# Patient Record
Sex: Female | Born: 1985 | Race: Black or African American | Hispanic: No | Marital: Single | Smoking: Never smoker
Health system: Southern US, Community
[De-identification: ages and names within clinical notes are randomized; demographics above are authoritative.]

## PROBLEM LIST (undated history)

## (undated) DIAGNOSIS — I10 Essential (primary) hypertension: Secondary | ICD-10-CM

## (undated) DIAGNOSIS — R011 Cardiac murmur, unspecified: Secondary | ICD-10-CM

## (undated) HISTORY — PX: DENTAL SURGERY: SHX609

---
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
## 2007-07-13 ENCOUNTER — Emergency Department (HOSPITAL_COMMUNITY): Admission: EM | Admit: 2007-07-13 | Discharge: 2007-07-13 | Payer: Self-pay | Admitting: Emergency Medicine

## 2007-10-17 ENCOUNTER — Emergency Department (HOSPITAL_COMMUNITY): Admission: EM | Admit: 2007-10-17 | Discharge: 2007-10-17 | Payer: Self-pay | Admitting: Emergency Medicine

## 2008-04-07 ENCOUNTER — Ambulatory Visit (HOSPITAL_COMMUNITY): Admission: RE | Admit: 2008-04-07 | Discharge: 2008-04-07 | Payer: Self-pay | Admitting: Obstetrics and Gynecology

## 2008-04-07 ENCOUNTER — Encounter (INDEPENDENT_AMBULATORY_CARE_PROVIDER_SITE_OTHER): Payer: Self-pay | Admitting: Obstetrics and Gynecology

## 2008-08-10 ENCOUNTER — Emergency Department (HOSPITAL_COMMUNITY): Admission: EM | Admit: 2008-08-10 | Discharge: 2008-08-10 | Payer: Self-pay | Admitting: Emergency Medicine

## 2009-02-07 ENCOUNTER — Encounter: Admission: RE | Admit: 2009-02-07 | Discharge: 2009-02-07 | Payer: Self-pay | Admitting: Family Medicine

## 2009-11-29 ENCOUNTER — Emergency Department (HOSPITAL_COMMUNITY): Admission: EM | Admit: 2009-11-29 | Discharge: 2009-11-29 | Payer: Self-pay | Admitting: Family Medicine

## 2010-08-27 ENCOUNTER — Emergency Department (HOSPITAL_COMMUNITY): Admission: EM | Admit: 2010-08-27 | Discharge: 2010-08-27 | Payer: Self-pay | Admitting: *Deleted

## 2010-11-04 ENCOUNTER — Emergency Department (HOSPITAL_COMMUNITY)
Admission: EM | Admit: 2010-11-04 | Discharge: 2010-11-04 | Payer: Self-pay | Source: Home / Self Care | Attending: Emergency Medicine | Admitting: Emergency Medicine

## 2011-02-13 LAB — URINALYSIS, ROUTINE W REFLEX MICROSCOPIC
Glucose, UA: NEGATIVE mg/dL
Ketones, ur: NEGATIVE mg/dL
Protein, ur: 100 mg/dL — AB
Urobilinogen, UA: 0.2 mg/dL (ref 0.0–1.0)

## 2011-02-13 LAB — POCT CARDIAC MARKERS
CKMB, poc: <1 ng/mL — ABNORMAL LOW (ref 1.0–8.0)
Myoglobin, poc: 67 ng/mL (ref 12–200)
Troponin i, poc: <0.05 ng/mL (ref 0.00–0.09)

## 2011-02-13 LAB — DIFFERENTIAL
Lymphocytes Relative: 21 % (ref 12–46)
Lymphs Abs: 1.5 10*3/uL (ref 0.7–4.0)
Monocytes Relative: 7 % (ref 3–12)
Neutro Abs: 5.3 10*3/uL (ref 1.7–7.7)
Neutrophils Relative %: 71 % (ref 43–77)

## 2011-02-13 LAB — POCT I-STAT, CHEM 8
Creatinine, Ser: 0.8 mg/dL (ref 0.4–1.2)
HCT: 41 % (ref 36.0–46.0)
Hemoglobin: 13.9 g/dL (ref 12.0–15.0)
Potassium: 3.8 mEq/L (ref 3.5–5.1)
Sodium: 140 mEq/L (ref 135–145)
TCO2: 25 mmol/L (ref 0–100)

## 2011-02-13 LAB — CBC
HCT: 39.2 % (ref 36.0–46.0)
Hemoglobin: 13.2 g/dL (ref 12.0–15.0)
MCH: 28 pg (ref 26.0–34.0)
MCHC: 33.6 g/dL (ref 30.0–36.0)
MCV: 83.4 fL (ref 78.0–100.0)
Platelets: 197 10*3/uL (ref 150–400)
RBC: 4.71 MIL/uL (ref 3.87–5.11)
RDW: 14.1 % (ref 11.5–15.5)
WBC: 7.4 10*3/uL (ref 4.0–10.5)

## 2011-02-13 LAB — URINE MICROSCOPIC-ADD ON

## 2011-03-03 LAB — POCT URINALYSIS DIP (DEVICE)
Glucose, UA: NEGATIVE mg/dL
Hgb urine dipstick: NEGATIVE
Ketones, ur: NEGATIVE mg/dL
Protein, ur: NEGATIVE mg/dL
Specific Gravity, Urine: 1.02 (ref 1.005–1.030)

## 2011-03-03 LAB — URINE CULTURE
Colony Count: NO GROWTH
Culture: NO GROWTH

## 2011-03-03 LAB — POCT PREGNANCY, URINE: Preg Test, Ur: NEGATIVE

## 2011-04-15 NOTE — Op Note (Signed)
NAMEWARRENE, Meredith Yoder          ACCOUNT NO.:  [id number]   MEDICAL RECORD NO.:  [id number]          PATIENT TYPE:  AMB   LOCATION:  SDC                           FACILITY:  [facility]   PHYSICIAN:  Maxie Better, M.D.DATE OF BIRTH:  [birth year]   DATE OF PROCEDURE:  04/07/2008  DATE OF DISCHARGE:                               OPERATIVE REPORT   PREOPERATIVE DIAGNOSIS:  Elective termination.   PROCEDURE:  Suction dilation and evacuation.   POSTOPERATIVE DIAGNOSIS:  Elective termination.   ANESTHESIA:  MAC paracervical block.   SURGEON:  Maxie Better, MD   PROCEDURE:  Under adequate monitored anesthesia, the patient was placed  in the dorsal lithotomy position.  She was sterilely prepped and draped  in usual fashion.  Bladder was catheterized for a moderate amount of  urine.  Examination under anesthesia revealed anteverted 8-week size  uterus.  No adnexal masses could be appreciated.  Bivalve speculum was  placed in the vagina.  20 mL of 1% Nesacaine was injected paracervical  at 3 and 9 o'clock.  The anterior lip of the cervix was grasped with a  single-tooth tenaculum.  The cervix was then fully dilated up to #27  [facility] dilator and #7-mm curved suction cannula was introduced in the  uterine cavity.  The cavity was suctioned, curetted, and suctioned.  When all tissue was felt to be removed, all instruments were then  removed from the vagina.   SPECIMEN:  Labeled products of conception were sent to pathology.   ESTIMATED BLOOD LOSS:  Minimal.   COMPLICATIONS:  None.   MATERNAL BLOOD TYPE:  O+.   The patient tolerated the procedure well and was transferred to recovery  room in stable condition.      Maxie Better, M.D.  Electronically Signed     [place]/MEDQ  D:  04/07/2008  T:  04/08/2008  Job:  [id number]

## 2012-06-25 ENCOUNTER — Encounter (HOSPITAL_COMMUNITY): Payer: Self-pay | Admitting: *Deleted

## 2012-06-25 ENCOUNTER — Emergency Department (HOSPITAL_COMMUNITY)
Admission: EM | Admit: 2012-06-25 | Discharge: 2012-06-25 | Disposition: A | Discharge status: Home / Self Care | Payer: 59 | Attending: Emergency Medicine | Admitting: Emergency Medicine

## 2012-06-25 DIAGNOSIS — R102 Pelvic and perineal pain: Secondary | ICD-10-CM

## 2012-06-25 DIAGNOSIS — N949 Unspecified condition associated with female genital organs and menstrual cycle: Secondary | ICD-10-CM | POA: Insufficient documentation

## 2012-06-25 DIAGNOSIS — N920 Excessive and frequent menstruation with regular cycle: Secondary | ICD-10-CM | POA: Insufficient documentation

## 2012-06-25 MED ORDER — HYDROCODONE-IBUPROFEN 7.5-200 MG PO TABS: ORAL_TABLET | ORAL | Status: DC

## 2012-06-25 MED ORDER — IBUPROFEN 800 MG PO TABS
800.0000 mg | ORAL_TABLET | Freq: Once | ORAL | Status: AC
  Administered 2012-06-25: 800 mg via ORAL
  Filled 2012-06-25: qty 1

## 2012-06-25 MED ORDER — HYDROCODONE-ACETAMINOPHEN 5-325 MG PO TABS
1.0000 | ORAL_TABLET | Freq: Once | ORAL | Status: AC
  Administered 2012-06-25: 1 via ORAL
  Filled 2012-06-25: qty 1

## 2012-06-25 NOTE — ED Notes (Signed)
Pt her menstrual cycle came on and she is bleeding more than normal and having increased cramping. Pt denies any n/v

## 2012-06-25 NOTE — ED Provider Notes (Signed)
History     CSN: [id number]  Arrival date & time 06/25/12  1751   First MD Initiated Contact with Patient 06/25/12 1907      Chief Complaint  Patient presents with  . Abdominal Pain    (Consider location/radiation/quality/duration/timing/severity/associated sxs/prior treatment) HPI Comments: Started menses 2 days ago.  "bleeding a little more than usual and cramping more than usual".  Has regular menses.   Does not  Feel she is pregnant.  No fever or chills.  Has PCP and GYN MDs.  Took 2 gm of tylenol this AM with no relief.  Patient is a 26 y.o. female presenting with abdominal pain. The history is provided by the patient. No language interpreter was used.  Abdominal Pain The primary symptoms of the illness include abdominal pain and vaginal bleeding. The primary symptoms of the illness do not include fever, nausea, vomiting, diarrhea, dysuria or vaginal discharge.  The patient states that she believes she is currently not pregnant. The patient has not had a change in bowel habit. Symptoms associated with the illness do not include urgency, hematuria or frequency.    History reviewed. No pertinent past medical history.  History reviewed. No pertinent past surgical history.  No family history on file.  History  Substance Use Topics  . Smoking status: Never Smoker   . Smokeless tobacco: Not on file  . Alcohol Use: Yes    OB History    Grav Para Term Preterm Abortions TAB SAB Ect Mult Living                  Review of Systems  Constitutional: Negative for fever.  Gastrointestinal: Positive for abdominal pain. Negative for nausea, vomiting and diarrhea.  Genitourinary: Positive for vaginal bleeding, menstrual problem and pelvic pain. Negative for dysuria, urgency, frequency, hematuria, flank pain and vaginal discharge.  All other systems reviewed and are negative.    Allergies  Review of patient's allergies indicates no known allergies.  Home Medications   Current  Outpatient Rx  Name Route Sig Dispense Refill  . HYDROCODONE-IBUPROFEN 7.5-200 MG PO TABS  One tab po q 6 hrs prn pain 20 tablet 0    BP 143/73  Pulse 69  Temp 99.3 F (37.4 C) (Oral)  Resp 16  SpO2 100%  LMP 06/25/2012  Physical Exam  Nursing note and vitals reviewed. Constitutional: She is oriented to person, place, and time. She appears well-developed and well-nourished. No distress.  HENT:  Head: Normocephalic and atraumatic.  Eyes: EOM are normal.  Neck: Normal range of motion.  Cardiovascular: Normal rate, regular rhythm and normal heart sounds.   Pulmonary/Chest: Effort normal and breath sounds normal.  Abdominal: Soft. She exhibits no distension. There is tenderness in the suprapubic area. There is no rigidity, no rebound, no guarding and no CVA tenderness.    Genitourinary: No vaginal discharge found.  Musculoskeletal: Normal range of motion.  Neurological: She is alert and oriented to person, place, and time.  Skin: Skin is warm and dry.  Psychiatric: She has a normal mood and affect. Judgment normal.    ED Course  Procedures (including critical care time)  Labs Reviewed - No data to display No results found.   1. Menorrhagia with regular cycle   2. Pelvic pain in female       MDM  rx-hydrocodone, 20 Ibuprofen 800 mg TID F/u with your GYN MD        Evalina Field, PA 06/25/12 1925

## 2012-06-26 NOTE — ED Provider Notes (Signed)
Medical screening examination/treatment/procedure(s) were conducted as a shared visit with non-physician practitioner(s) and myself.  I personally evaluated the patient during the encounter  Derwood Kaplan, MD 06/26/12 1449

## 2012-07-26 ENCOUNTER — Emergency Department (HOSPITAL_COMMUNITY)
Admission: EM | Admit: 2012-07-26 | Discharge: 2012-07-26 | Disposition: A | Discharge status: Home / Self Care | Payer: 59 | Attending: Emergency Medicine | Admitting: Emergency Medicine

## 2012-07-26 ENCOUNTER — Emergency Department (HOSPITAL_COMMUNITY): Payer: 59

## 2012-07-26 ENCOUNTER — Encounter (HOSPITAL_COMMUNITY): Payer: Self-pay | Admitting: *Deleted

## 2012-07-26 DIAGNOSIS — Z043 Encounter for examination and observation following other accident: Secondary | ICD-10-CM | POA: Insufficient documentation

## 2012-07-26 MED ORDER — METHOCARBAMOL 750 MG PO TABS: 750.0000 mg | ORAL_TABLET | Freq: Four times a day (QID) | ORAL | Status: AC

## 2012-07-26 MED ORDER — IBUPROFEN 600 MG PO TABS: 600.0000 mg | ORAL_TABLET | Freq: Four times a day (QID) | ORAL | Status: AC | PRN

## 2012-07-26 MED ORDER — ACETAMINOPHEN 325 MG PO TABS
650.0000 mg | ORAL_TABLET | Freq: Once | ORAL | Status: AC
  Administered 2012-07-26: 650 mg via ORAL
  Filled 2012-07-26: qty 2
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
## 2012-07-26 NOTE — ED Notes (Signed)
Pt taken off back board

## 2015-03-26 ENCOUNTER — Emergency Department (HOSPITAL_BASED_OUTPATIENT_CLINIC_OR_DEPARTMENT_OTHER)
Admission: EM | Admit: 2015-03-26 | Discharge: 2015-03-26 | Disposition: A | Discharge status: Home / Self Care | Payer: Self-pay | Attending: Emergency Medicine | Admitting: Emergency Medicine

## 2015-03-26 ENCOUNTER — Encounter (HOSPITAL_BASED_OUTPATIENT_CLINIC_OR_DEPARTMENT_OTHER): Payer: Self-pay | Admitting: *Deleted

## 2015-03-26 DIAGNOSIS — E669 Obesity, unspecified: Secondary | ICD-10-CM | POA: Insufficient documentation

## 2015-03-26 DIAGNOSIS — Z3202 Encounter for pregnancy test, result negative: Secondary | ICD-10-CM | POA: Insufficient documentation

## 2015-03-26 DIAGNOSIS — R609 Edema, unspecified: Secondary | ICD-10-CM | POA: Insufficient documentation

## 2015-03-26 LAB — URINE MICROSCOPIC-ADD ON

## 2015-03-26 LAB — BASIC METABOLIC PANEL
Anion gap: 7 (ref 5–15)
BUN: 12 mg/dL (ref 6–23)
CHLORIDE: 107 mmol/L (ref 96–112)
CO2: 25 mmol/L (ref 19–32)
Calcium: 8.8 mg/dL (ref 8.4–10.5)
Creatinine, Ser: 0.86 mg/dL (ref 0.50–1.10)
GFR calc Af Amer: >90 mL/min (ref >90)
GFR calc non Af Amer: >90 mL/min (ref >90)
GLUCOSE: 84 mg/dL (ref 70–99)
POTASSIUM: 3.4 mmol/L — AB (ref 3.5–5.1)
SODIUM: 139 mmol/L (ref 135–145)

## 2015-03-26 LAB — URINALYSIS, ROUTINE W REFLEX MICROSCOPIC
BILIRUBIN URINE: NEGATIVE
Glucose, UA: NEGATIVE mg/dL
Hgb urine dipstick: NEGATIVE
KETONES UR: NEGATIVE mg/dL
NITRITE: NEGATIVE
PROTEIN: NEGATIVE mg/dL
SPECIFIC GRAVITY, URINE: 1.025 (ref 1.005–1.030)
UROBILINOGEN UA: 0.2 mg/dL (ref 0.0–1.0)
pH: 7 (ref 5.0–8.0)

## 2015-03-26 LAB — PREGNANCY, URINE: PREG TEST UR: NEGATIVE

## 2015-03-26 NOTE — ED Provider Notes (Signed)
CSN: [id number]     Arrival date & time 03/26/15  1528 History   First MD Initiated Contact with Patient 03/26/15 1723     Chief Complaint  Patient presents with  . Leg Swelling     (Consider location/radiation/quality/duration/timing/severity/associated sxs/prior Treatment) HPI  Pt is a 29yo obese female, presenting to ED with reports of bilateral arm and leg swelling that has worsening over the last 3 days with associated mild lower leg cramping bilaterally.  Leg cramps are 8/10 at worst.  Denies taking any pain medication PTA. Pt states she sits at a desk all day and thinks they may be what caused her feet to swell but no increase in walking or standing. Denies taking any daily medications. Denies hx of CAD, CHF, DM, DVT or PE. Denies chest pain or SOB. Denies increased weight change.  States she had a physical 1 year ago and was "healthy" at that time. Denies hx of HTN or high cholesterol. Denies any other symptoms.   History reviewed. No pertinent past medical history. History reviewed. No pertinent past surgical history. No family history on file. History  Substance Use Topics  . Smoking status: Never Smoker   . Smokeless tobacco: Not on file  . Alcohol Use: Yes   OB History    No data available     Review of Systems  Constitutional: Negative for fever, chills, diaphoresis, fatigue and unexpected weight change.  Respiratory: Negative for cough, shortness of breath, wheezing and stridor.   Cardiovascular: Positive for leg swelling. Negative for chest pain and palpitations.  Gastrointestinal: Negative for nausea, vomiting, abdominal pain and diarrhea.  Musculoskeletal: Positive for myalgias. Negative for arthralgias.  All other systems reviewed and are negative.     Allergies  Review of patient's allergies indicates no known allergies.  Home Medications   Prior to Admission medications   Medication Sig Start Date End Date Taking? Authorizing Provider   HYDROcodone-ibuprofen (VICOPROFEN) 7.5-200 MG per tablet Take 1 tablet by mouth every 6 (six) hours as needed. For pain.    Historical Provider, MD   BP 137/88 mmHg  Pulse 97  Temp(Src) 98.9 F (37.2 C) (Oral)  Resp 20  Ht 5\' 6"  (1.676 m)  Wt 270 lb (122.471 kg)  BMI 43.60 kg/m2  SpO2 100%  LMP 03/01/2015 Physical Exam  Constitutional: She appears well-developed and well-nourished. No distress.  Obese female lying comfortably on exam bed, NAD  HENT:  Head: Normocephalic and atraumatic.  Eyes: Conjunctivae are normal. No scleral icterus.  Neck: Normal range of motion.  Cardiovascular: Normal rate, regular rhythm and normal heart sounds.   Pulses:      Radial pulses are 2+ on the right side, and 2+ on the left side.       Dorsalis pedis pulses are 2+ on the right side, and 2+ on the left side.  Pulmonary/Chest: Effort normal and breath sounds normal. No respiratory distress. She has no wheezes. She has no rales. She exhibits no tenderness.  Abdominal: Soft. Bowel sounds are normal. She exhibits no distension and no mass. There is no tenderness. There is no rebound and no guarding.  Musculoskeletal: Normal range of motion. She exhibits no edema or tenderness.  No edema appreciated on exam.   Neurological: She is alert.  Skin: Skin is warm and dry. She is not diaphoretic. No erythema.  Nursing note and vitals reviewed.   ED Course  Procedures (including critical care time) Labs Review Labs Reviewed  URINALYSIS, ROUTINE W REFLEX MICROSCOPIC -  Abnormal; Notable for the following:    APPearance CLOUDY (*)    Leukocytes, UA MODERATE (*)    All other components within normal limits  URINE MICROSCOPIC-ADD ON - Abnormal; Notable for the following:    Squamous Epithelial / LPF MANY (*)    Bacteria, UA MANY (*)    All other components within normal limits  BASIC METABOLIC PANEL - Abnormal; Notable for the following:    Potassium 3.4 (*)    All other components within normal limits   PREGNANCY, URINE    Imaging Review No results found.   EKG Interpretation None      MDM   Final diagnoses:  Peripheral edema    Pt presenting to ED with c/o edema in upper and lower extremities bilaterally.  Lungs: CTAB. Vitals: WNL. Renal function: WNL.  PERC negative.  UA: many bacteria, however, also many squamous cells.  Pt not c/o urinary symptoms. Will not tx for UTI.    Discussed pt with Dr. Judd Lien, no evidence of emergent process taking place at this time, including but not limited to- PE, DVT, ACS, CHF, ARF.  Home care instructions provided. Return precautions provided. Pt verbalized understanding and agreement with tx plan.    Junius Finner, PA-C 03/27/15 0129  Paula Libra, MD 03/27/15 4098  Geoffery Lyons, MD 03/30/15 2014

## 2015-03-26 NOTE — ED Notes (Signed)
Swelling to her ankles and legs x 3 days. Feels like her arms are puffy.

## 2015-03-26 NOTE — Discharge Instructions (Signed)
Peripheral Edema °You have swelling in your legs (peripheral edema). This swelling is due to excess accumulation of salt and water in your body. Edema may be a sign of heart, kidney or liver disease, or a side effect of a medication. It may also be due to problems in the leg veins. Elevating your legs and using special support stockings may be very helpful, if the cause of the swelling is due to poor venous circulation. Avoid long periods of standing, whatever the cause. °Treatment of edema depends on identifying the cause. Chips, pretzels, pickles and other salty foods should be avoided. Restricting salt in your diet is almost always needed. Water pills (diuretics) are often used to remove the excess salt and water from your body via urine. These medicines prevent the kidney from reabsorbing sodium. This increases urine flow. °Diuretic treatment may also result in lowering of potassium levels in your body. Potassium supplements may be needed if you have to use diuretics daily. Daily weights can help you keep track of your progress in clearing your edema. You should call your caregiver for follow up care as recommended. °SEEK IMMEDIATE MEDICAL CARE IF:  °· You have increased swelling, pain, redness, or heat in your legs. °· You develop shortness of breath, especially when lying down. °· You develop chest or abdominal pain, weakness, or fainting. °· You have a fever. °Document Released: 12/25/2004 Document Revised: 02/09/2012 Document Reviewed: 12/05/2009 °ExitCare® Patient Information ©2015 [employer]. This information is not intended to replace advice given to you by your health care provider. Make sure you discuss any questions you have with your health care provider. ° ° °Emergency Department Resource Guide °1) Find a Doctor and Pay Out of Pocket °Although you won't have to find out who is covered by your insurance plan, it is a good idea to ask around and get recommendations. You will then need to call the  office and see if the doctor you have chosen will accept you as a new patient and what types of options they offer for patients who are self-pay. Some doctors offer discounts or will set up payment plans for their patients who do not have insurance, but you will need to ask so you aren't surprised when you get to your appointment. ° °2) Contact Your Local Health Department °Not all health departments have doctors that can see patients for sick visits, but many do, so it is worth a call to see if yours does. If you don't know where your local health department is, you can check in your phone book. The CDC also has a tool to help you locate your state's health department, and many state websites also have listings of all of their local health departments. ° °3) Find a Walk-in Clinic °If your illness is not likely to be very severe or complicated, you may want to try a walk in clinic. These are popping up all over the country in pharmacies, drugstores, and shopping centers. They're usually staffed by nurse practitioners or physician assistants that have been trained to treat common illnesses and complaints. They're usually fairly quick and inexpensive. However, if you have serious medical issues or chronic medical problems, these are probably not your best option. ° °No Primary Care Doctor: °- Call [facility] at  832-8000 - they can help you locate a primary care doctor that  accepts your insurance, provides certain services, etc. °- Physician Referral Service- 1-800-533-3463 ° °Chronic Pain Problems: °Organization           Address  Phone   Notes  ° [facility]  (336) 297-2271 Patients need to be referred by their primary care doctor.  ° °Medication Assistance: °Organization         Address  Phone   Notes  °[place] [facility] [address] °[place], [place] [address] (336) 641-8030 --Must be a resident of [place] °-- Must have NO insurance coverage  whatsoever (no Medicaid/ Medicare, etc.) °-- The pt. MUST have a primary care doctor that directs their care regularly and follows them in the community °  °[facility]  (866) 331-1348   °[employer]  (888) 892-1162   ° °Agencies that provide inexpensive medical care: °Organization         Address  Phone   Notes  °[facility]  (336) 832-8035   °[facility]    (336) 832-7272   °[facility] [address] °[place], [place] [address] (336) 832-4777   °[facility] [address], °[place] (336) 271-4999   °[facility]    (336) 373-0678   °[facility]    (336) 272-1050   °[facility] ° [address], [place] Phone:  (336) 832-4444, Fax:  (336) 832-4440 Hours of Operation:  9 am - 6 pm, M-F.  Also accepts Medicaid/Medicare and self-pay.  °[facility] ° [address], [place] Phone: (336) 832-3150, Fax: (336) 832-3151. Hours of Operation:  8:30 am - 5:30 pm, M-F.  Also accepts Medicaid and self-pay.  °[facility] [place] [address], [place] Phone: (336) 878-6027   °[facility] [address], [place], [place] (336)723-1848, Ext. 123 Mondays & Thursdays: 7-9 AM.  First 15 patients are seen on a first come, first serve basis. °  ° °Medicaid-accepting [place] Providers: ° °Organization         Address  Phone   Notes  °[facility] [address], [place] (336) 641-2100 Also accepts self-pay patients.  °[facility] [address], [place] ° (336) 856-9996   °[facility] [address], [place] (336) 288-8857   °[facility] [address] [place] [address], [place] (336) 299-7000   °Veita Bland [address], [place]  ° (336) 373-1557 Only accepts [place] Access Medicaid patients after they have their name applied  to their card.  ° °Self-Pay (no insurance) in [place]: ° °Organization         Address  Phone   Notes  °Sickle Cell Patients, [facility] [address], [place] (336) 832-1970   °[facility] [address], [place] (336) 832-4400   °[facility] ° [address], [place] (336) 992-4800   °[facility]/Dr. Osei-Bonsu ° [address] [place] [address], [place] or [address], [place] (336) 841-8500 Phone number for both [place] and [place] locations is the same.  °[facility] [address], [place] (336) 299-0000   °[facility] [address] [place] [address], [place] or [address] (336) 852-7530 °(336) 878-2260   °[facility] [address],  (336) 350-1642, phone; (336) 294-5005, fax Sees patients 1st and 3rd Saturday of every month.  Must not qualify   for public or private insurance (i.e. Medicaid, Medicare, [place] Health Choice, Veterans' Benefits) • Household income should be no more than 200% of the poverty level •The clinic cannot treat you if you are pregnant or think you are pregnant • Sexually transmitted diseases are not treated at the clinic.  ° ° °Dental Care: °Organization         Address  Phone  Notes  °[facility] [address], [place] (336) 641-6152 Accepts children up to age 21 who are enrolled in Medicaid or [place] Health Choice; pregnant women with a Medicaid card; and children who have applied for Medicaid or [place] Health Choice, but were declined, whose parents can pay a reduced fee at time of service.  °[facility] [place]  [address], [place] (336) 641-7733 Accepts children up to age 21 who are enrolled in Medicaid or [place] Health Choice; pregnant women with a Medicaid card; and children who have applied for Medicaid or [place]  Health Choice, but were declined, whose parents can pay a reduced fee at time of service.  °[facility] ° [address], [place] (336) 641-4533 Patients are seen by appointment only. Walk-ins are not accepted. [facility] will see patients 18 years of age and older. °Monday - Tuesday (8am-5pm) °Most Wednesdays (8:30-5pm) °$30 per visit, cash only  °[facility] ° [address], [place] (336) 641-4533 Patients are seen by appointment only. Walk-ins are not accepted. [facility] will see patients 18 years of age and older. °One Wednesday Evening (Monthly: Volunteer Based).  $30 per visit, cash only  °[facility]  (919) 537-3737 for adults; Children under age 4, call Graduate Pediatric Dentistry at (919) [phone]. Children aged 4-14, please call (919) [phone] to request a pediatric application. ° Dental services are provided in all areas of dental care including fillings, crowns and bridges, complete and partial dentures, implants, gum treatment, root canals, and extractions. Preventive care is also provided. Treatment is provided to both adults and children. °Patients are selected via a lottery and there is often a waiting list. °  °[facility] [address], °[place] ° (336) 763-8833 [email or web address] °  °[facility] [address], [place], [place] (336)723-1848, Ext. 123 Second and Fourth Thursday of each month, opens at 6:30 AM; Clinic ends at 9 AM.  Patients are seen on a first-come first-served basis, and a limited number are seen during each clinic.  ° °[facility] ° [address], [place], [place] (336) 723-7904   Eligibility Requirements °You must have lived in [place], [place], or [place] counties for at least the last three months. °  You cannot be eligible for state or federal sponsored healthcare insurance, including Veterans Administration, Medicaid, or Medicare. °   You generally cannot be eligible for healthcare insurance through your employer.  °  How to apply: °Eligibility screenings are held every Tuesday and Wednesday afternoon from 1:00 pm until 4:00 pm. You do not need an appointment for the interview!  °[facility] [address], [place], [place] 336-631-2330   °[facility]  336-342-8273   °[facility]  336-703-3100   °[facility]  336-570-6415   ° °Behavioral Health Resources in the Community: °Intensive Outpatient Programs °Organization         Address  Phone    Notes  °[facility] [address], [place], [place] 336-878-6098   °[facility] [address], [place], [place] 336-832-9800   °[facility] [address], [place], [place] ° 336-882-2125   °[facility] [address],  °[place], [place] 1-800-853-5163 or 336-641-4981   °Substance Abuse Resources °Organization         Address  Phone  Notes  °[facility]  336-882-2125   °[facility]  336-784-9470   °[facility]  336-285-9073   °[facility]  336-845-3988   °Residential & Outpatient Substance Abuse Program  1-800-659-3381   °Psychological Services °Organization         Address  Phone  Notes  ° Health  336- 832-9600   °[facility]  336- 378-7881   °[facility] [address], [place] 1-800-853-5163 or 336-641-4981   ° °Mobile Crisis Teams °Organization         Address  Phone  Notes  °[facility], Mobile Crisis Care Unit  1-877-626-1772   °Assertive °[facility] ° [address] [place], [place] 336-834-9664   °Sharon DeEsch [address] °[place] 336-554-5454   ° °Self-Help/Support Groups °Organization         Address  Phone             Notes  °[facility] - variety of support groups  336- 373-1402 Call  for more information  °Narcotics Anonymous (NA), [facility] [address], °[place]  2 meetings at this location  ° °Residential Treatment Programs °Organization         Address  Phone  Notes  °[facility] [address],    °[place]  1-866-801-8205   °[facility] ° [address], [place], [place] 704-293-8524   °[facility] [address], [place] 336-845-3988 Admissions: 8am-3pm M-F  °[facility] [address]    °[place], [place] 336-841-1104   °[facility] [address], [place], [place] 336-379-7146   °[facility] [address]  °[place], [place] 336-285-9073   °[facility] - Intensive Outpatient [address], [place], [place] 336-852-3033   °[facility] [address]  °[place], [place] 1-877-615-2722 or 336-784-9470   °[facility] [address] [place], [place] 336-227-7417 Accepts Medicaid  °[facility] [address]  ° [place] 1-800-659-3381 Substance Abuse/Addiction Treatment  ° °[place] Behavioral Health Resources °Organization         Address  Phone  Notes  °[facility]  (888) 581-9988   °Julie Brannon, PhD [address] [place], [place]   (336) 349-5553 or (336) 951-0000   °[facility]   [address] °[place], [place] (336) 349-4454   °[facility] [address], [place], [place] (336) 342-8316 Insurance/Medicaid/sponsorship through [facility]  °[facility] [address]                                    [place], [place] (336) 342-8316 Therapy/tele-psych/case  °[facility] [address]  ° [place], [place] (336) 349-2233    °Dr. Arfeen  (336) 349-4544   °[facility] [place]  [employer] [place]   [facility] 1) [address], [place] °2) [address], [place] °3)  [address], [place]  (336) 349-3220 °(336) 342-7768 ° °(336) 342-8140   °[place] [facility] (336) 342-1394 or (336) 342-3537 (After Hours)    ° ° ° °

## 2015-09-18 ENCOUNTER — Emergency Department (HOSPITAL_COMMUNITY): Payer: Self-pay

## 2015-09-18 ENCOUNTER — Encounter (HOSPITAL_COMMUNITY): Payer: Self-pay | Admitting: Emergency Medicine

## 2015-09-18 ENCOUNTER — Emergency Department (HOSPITAL_COMMUNITY)
Admission: EM | Admit: 2015-09-18 | Discharge: 2015-09-18 | Disposition: A | Discharge status: Home / Self Care | Payer: Self-pay | Attending: Emergency Medicine | Admitting: Emergency Medicine

## 2015-09-18 DIAGNOSIS — R079 Chest pain, unspecified: Secondary | ICD-10-CM

## 2015-09-18 DIAGNOSIS — R0789 Other chest pain: Secondary | ICD-10-CM | POA: Insufficient documentation

## 2015-09-18 DIAGNOSIS — R110 Nausea: Secondary | ICD-10-CM | POA: Insufficient documentation

## 2015-09-18 DIAGNOSIS — R0602 Shortness of breath: Secondary | ICD-10-CM | POA: Insufficient documentation

## 2015-09-18 LAB — CBC
HCT: 38.3 % (ref 36.0–46.0)
Hemoglobin: 12.2 g/dL (ref 12.0–15.0)
MCH: 26 pg (ref 26.0–34.0)
MCHC: 31.9 g/dL (ref 30.0–36.0)
MCV: 81.7 fL (ref 78.0–100.0)
PLATELETS: 223 10*3/uL (ref 150–400)
RBC: 4.69 MIL/uL (ref 3.87–5.11)
RDW: 14.9 % (ref 11.5–15.5)
WBC: 5.3 10*3/uL (ref 4.0–10.5)

## 2015-09-18 LAB — BASIC METABOLIC PANEL
Anion gap: 5 (ref 5–15)
BUN: 11 mg/dL (ref 6–20)
CALCIUM: 8.9 mg/dL (ref 8.9–10.3)
CO2: 24 mmol/L (ref 22–32)
CREATININE: 0.86 mg/dL (ref 0.44–1.00)
Chloride: 109 mmol/L (ref 101–111)
GFR calc non Af Amer: >60 mL/min (ref >60)
Glucose, Bld: 87 mg/dL (ref 65–99)
Potassium: 3.6 mmol/L (ref 3.5–5.1)
SODIUM: 138 mmol/L (ref 135–145)

## 2015-09-18 LAB — I-STAT TROPONIN, ED: TROPONIN I, POC: 0 ng/mL (ref 0.00–0.08)

## 2015-09-18 MED ORDER — PANTOPRAZOLE SODIUM 20 MG PO TBEC: 20.0000 mg | DELAYED_RELEASE_TABLET | Freq: Every day | ORAL | Status: DC

## 2015-09-18 MED ORDER — PANTOPRAZOLE SODIUM 40 MG PO TBEC
40.0000 mg | DELAYED_RELEASE_TABLET | Freq: Once | ORAL | Status: AC
  Administered 2015-09-18: 40 mg via ORAL
  Filled 2015-09-18: qty 1

## 2015-09-18 MED ORDER — ACETAMINOPHEN 500 MG PO TABS
1000.0000 mg | ORAL_TABLET | Freq: Once | ORAL | Status: AC
  Administered 2015-09-18: 1000 mg via ORAL
  Filled 2015-09-18: qty 2

## 2015-09-18 NOTE — Discharge Instructions (Signed)
Take your prescription of Protonix as prescribed. You may also take Tylenol as prescribed over the counter for pain relief.  Follow-up with your primary care provider in 4-5 days.  Return to the emergency department if symptoms worsen or new onset of fever, cough, wheezing, difficulty breathing, leg swelling.

## 2015-09-18 NOTE — ED Notes (Signed)
Per pt, states constant chest pain for 2 days-SOB on and off for a couple of days

## 2015-09-18 NOTE — ED Notes (Signed)
Patient left without discharge paperwork.

## 2015-09-18 NOTE — ED Provider Notes (Signed)
CSN: [id number]     Arrival date & time 09/18/15  1116 History   First MD Initiated Contact with Patient 09/18/15 1228     Chief Complaint  Patient presents with  . chest pain/SOB      (Consider location/radiation/quality/duration/timing/severity/associated sxs/prior Treatment) HPI Comments: Patient is a 29 year old female presents to the ED with complaint of CP. Patient reports having waxing and waning "throbbing" mid sternal chest pain. She notes the pain is worsened with sitting/laying supine, alleviated with standing and walking. Endorses intermittent SOB. Denies fever, chills, headache, URI symptoms, wheezing, palpitations, cough, hemoptysis, abdominal pain, nausea, vomiting, diarrhea, lower extremity swelling, leg pain. Patient denies taking any medications at home for pain relief. Patient is not on any birth control or exogenous estrogen. Denies any recent surgeries. She endorses waking up this morning and feeling nauseous reports her nausea has since resolved.   History reviewed. No pertinent past medical history. History reviewed. No pertinent past surgical history. No family history on file. Social History  Substance Use Topics  . Smoking status: Never Smoker   . Smokeless tobacco: None  . Alcohol Use: Yes   OB History    No data available     Review of Systems  Respiratory: Positive for shortness of breath.   Cardiovascular: Positive for chest pain.  Gastrointestinal: Positive for nausea.  All other systems reviewed and are negative.     Allergies  Review of patient's allergies indicates no known allergies.  Home Medications   Prior to Admission medications   Medication Sig Start Date End Date Taking? Authorizing Provider  HYDROcodone-ibuprofen (VICOPROFEN) 7.5-200 MG per tablet Take 1 tablet by mouth every 6 (six) hours as needed. For pain.    Historical Provider, MD   BP 135/90 mmHg  Pulse 101  Temp(Src) 98.1 F (36.7 C) (Oral)  Resp 16  SpO2 100%  LMP  09/11/2015 Physical Exam  Constitutional: She is oriented to person, place, and time. She appears well-developed and well-nourished. No distress.  HENT:  Head: Normocephalic and atraumatic.  Mouth/Throat: Oropharynx is clear and moist. No oropharyngeal exudate.  Eyes: Conjunctivae and EOM are normal. Pupils are equal, round, and reactive to light. Right eye exhibits no discharge. Left eye exhibits no discharge. No scleral icterus.  Neck: Normal range of motion. Neck supple.  Cardiovascular: Normal rate, regular rhythm, normal heart sounds and intact distal pulses.   Pulmonary/Chest: Effort normal and breath sounds normal. No respiratory distress. She has no wheezes. She has no rales. She exhibits tenderness (mid-sternal chest wall mildly TTP).  Abdominal: Soft. Bowel sounds are normal. She exhibits no distension and no mass. There is no tenderness. There is no rebound and no guarding.  Musculoskeletal: Normal range of motion. She exhibits no edema or tenderness.  Lymphadenopathy:    She has no cervical adenopathy.  Neurological: She is alert and oriented to person, place, and time.  Skin: Skin is warm and dry. She is not diaphoretic.  Nursing note and vitals reviewed.   ED Course  Procedures (including critical care time) Labs Review Labs Reviewed  BASIC METABOLIC PANEL  CBC  I-STAT TROPOININ, ED    Imaging Review Dg Chest 2 View  09/18/2015  CLINICAL DATA:  Central chest pain for several days. Shortness of breath. EXAM: CHEST  2 VIEW COMPARISON:  08/27/2010 FINDINGS: The heart size and mediastinal contours are within normal limits. Both lungs are clear. The visualized skeletal structures are unremarkable. IMPRESSION: No active cardiopulmonary disease. Electronically Signed   By: Zollie BeckersWalter  Ova Freshwater M.D.   On: 09/18/2015 12:38   I have personally reviewed and evaluated these images and lab results as part of my medical decision-making.   EKG Interpretation None     Filed Vitals:    09/18/15 1453  BP: 144/89  Pulse: 66  Temp:   Resp: 16     MDM   Final diagnoses:  Chest pain, unspecified chest pain type    Patient presents with midsternal throbbing chest pain and intermittent shortness of breath. Mild relief with standing or walking. Denies any recent fall, trauma, injury. VSS. Exam revealed midsternal chest wall mildly TTP, remaining exam unremarkable. EKG shows normal sinus rhythm. Labs unremarkable. Troponin negative. Chest x-ray negative. PERC negative. I have a low suspicion for ACS, PE, dissection, or other acute cardiac event at this time. I suspect pain is likely either due to reflux or musculoskeletal etiology. Patient given Tylenol and PPI in ED. Patient reports pain has improved. Plan to discharge patient home. Advised patient to continue taking Tylenol at home for pain relief. Patient also given PPI rx. Patient advised to follow up with primary care provider this week.  Evaluation does not show pathology requring ongoing emergent intervention or admission. Pt is hemodynamically stable and mentating appropriately. Discussed findings/results and plan with patient/guardian, who agrees with plan. All questions answered. Return precautions discussed and outpatient follow up given.    Satira Sark [place], [place] 09/18/15 1523  Arby Barrette, MD 09/23/15 [id number]

## 2016-10-26 ENCOUNTER — Emergency Department (HOSPITAL_COMMUNITY)
Admission: EM | Admit: 2016-10-26 | Discharge: 2016-10-27 | Disposition: A | Discharge status: Home / Self Care | Payer: Self-pay | Attending: Emergency Medicine | Admitting: Emergency Medicine

## 2016-10-26 ENCOUNTER — Encounter (HOSPITAL_COMMUNITY): Payer: Self-pay | Admitting: Emergency Medicine

## 2016-10-26 DIAGNOSIS — R000 Tachycardia, unspecified: Secondary | ICD-10-CM

## 2016-10-26 DIAGNOSIS — Z79899 Other long term (current) drug therapy: Secondary | ICD-10-CM | POA: Insufficient documentation

## 2016-10-26 DIAGNOSIS — R002 Palpitations: Secondary | ICD-10-CM | POA: Insufficient documentation

## 2016-10-26 HISTORY — DX: Cardiac murmur, unspecified: R01.1

## 2016-10-26 NOTE — ED Triage Notes (Signed)
Brought by ems from home for c/o heart racing.  Reports no cardiac hx other than a murmur diagnosed years ago.  Having episodes of heart racing on and off for a week.  Worse today with chest tightness.  Given 300cc's NS en route.  Denies having any pain at this time.

## 2016-10-27 ENCOUNTER — Emergency Department (HOSPITAL_COMMUNITY): Payer: Self-pay

## 2016-10-27 LAB — BASIC METABOLIC PANEL
ANION GAP: 11 (ref 5–15)
BUN: 14 mg/dL (ref 6–20)
CALCIUM: 9 mg/dL (ref 8.9–10.3)
CO2: 18 mmol/L — ABNORMAL LOW (ref 22–32)
Chloride: 108 mmol/L (ref 101–111)
Creatinine, Ser: 0.85 mg/dL (ref 0.44–1.00)
GLUCOSE: 115 mg/dL — AB (ref 65–99)
POTASSIUM: 3.9 mmol/L (ref 3.5–5.1)
SODIUM: 137 mmol/L (ref 135–145)

## 2016-10-27 LAB — RAPID URINE DRUG SCREEN, HOSP PERFORMED
Amphetamines: NOT DETECTED
BARBITURATES: NOT DETECTED
Benzodiazepines: NOT DETECTED
COCAINE: NOT DETECTED
Opiates: NOT DETECTED
Tetrahydrocannabinol: NOT DETECTED

## 2016-10-27 LAB — I-STAT TROPONIN, ED
TROPONIN I, POC: 0.06 ng/mL (ref 0.00–0.08)
Troponin i, poc: 0.01 ng/mL (ref 0.00–0.08)

## 2016-10-27 LAB — POC URINE PREG, ED: Preg Test, Ur: NEGATIVE

## 2016-10-27 LAB — CBC
HCT: 38.4 % (ref 36.0–46.0)
HEMOGLOBIN: 12.2 g/dL (ref 12.0–15.0)
MCH: 25.4 pg — ABNORMAL LOW (ref 26.0–34.0)
MCHC: 31.8 g/dL (ref 30.0–36.0)
MCV: 80 fL (ref 78.0–100.0)
Platelets: 234 10*3/uL (ref 150–400)
RBC: 4.8 MIL/uL (ref 3.87–5.11)
RDW: 15.1 % (ref 11.5–15.5)
WBC: 10.5 10*3/uL (ref 4.0–10.5)

## 2016-10-27 LAB — MAGNESIUM: MAGNESIUM: 1.9 mg/dL (ref 1.7–2.4)

## 2016-10-27 LAB — D-DIMER, QUANTITATIVE: D-Dimer, Quant: 0.42 ug/mL-FEU (ref 0.00–0.50)

## 2016-10-27 LAB — TSH: TSH: 3.226 u[IU]/mL (ref 0.350–4.500)

## 2016-10-27 MED ORDER — IOPAMIDOL (ISOVUE-370) INJECTION 76%
INTRAVENOUS | Status: AC
  Filled 2016-10-27: qty 100

## 2016-10-27 MED ORDER — SODIUM CHLORIDE 0.9 % IV BOLUS (SEPSIS)
1000.0000 mL | Freq: Once | INTRAVENOUS | Status: AC
  Administered 2016-10-27: 1000 mL via INTRAVENOUS

## 2016-10-27 MED ORDER — IOPAMIDOL (ISOVUE-370) INJECTION 76%
INTRAVENOUS | Status: AC
  Administered 2016-10-27: 100 mL via INTRAVENOUS
  Filled 2016-10-27: qty 100

## 2016-10-27 MED ORDER — POTASSIUM CHLORIDE CRYS ER 20 MEQ PO TBCR
40.0000 meq | EXTENDED_RELEASE_TABLET | Freq: Once | ORAL | Status: AC
  Administered 2016-10-27: 40 meq via ORAL
  Filled 2016-10-27: qty 2

## 2016-10-27 NOTE — ED Notes (Signed)
Patient transported to X-ray 

## 2016-10-27 NOTE — ED Notes (Addendum)
Patient transported to CT 

## 2016-10-27 NOTE — ED Provider Notes (Signed)
By signing my name below, I, Meredith Yoder, attest that this documentation has been prepared under the direction and in the presence of [name], DO.  Electronically Signed: [name], ED Scribe. 10/27/16. 12:58 AM.   TIME SEEN: 12:52 AM   CHIEF COMPLAINT:  Chief Complaint  Patient presents with  . Palpitations     HPI:  HPI Comments: [name], brought in by EMS is a 30 y.o. female with a PMHx of a heart murmur as a child who presents to the Emergency Department complaining of constant, unchanged chest pain onset 8:30-9:00 PM this evening while doing laundry. Pain is described as pressure, "something sitting on my chest"On the left side without radiation. She also reports "my heart racing" which began at approximately 10:00 PM tonight. No aggravating or alleviating factors reported. 300 CC of NS given en route to department. No recent fever, chills, nausea, vomiting, or diarrhea. No bloody stools or melena. No shortness of breath. She is not a smoker. No history of PE, DVT, exogenous estrogen use, fracture, surgery, trauma, hospitalization, prolonged travel. No lower extremity swelling or pain. No calf tenderness. No prior family history of heart attacks at a young age. However, she states HTN and DM runs in her family.  PCP: No primary care provider on file.     ROS: See HPI Constitutional: no fever  Eyes: no drainage  ENT: no runny nose   Cardiovascular:  Chest pain and palpitations   Resp: no SOB  GI: no vomiting GU: no dysuria Integumentary: no rash  Allergy: no hives  Musculoskeletal: no leg swelling  Neurological: no slurred speech ROS otherwise negative  PAST MEDICAL HISTORY/PAST SURGICAL HISTORY:  Past Medical History:  Diagnosis Date  . Murmur     MEDICATIONS:  Prior to Admission medications   Medication Sig Start Date End Date Taking? Authorizing Provider  acetaminophen (TYLENOL) 500 MG tablet Take 1,500 mg by mouth daily as needed for  headache.    Historical Provider, MD  ibuprofen (ADVIL,MOTRIN) 200 MG tablet Take 600 mg by mouth 2 (two) times daily as needed for headache or moderate pain.    Historical Provider, MD  pantoprazole (PROTONIX) 20 MG tablet Take 1 tablet (20 mg total) by mouth daily. 09/18/15   Barrett HenleNicole Elizabeth Nadeau, PA-C    ALLERGIES:  No Known Allergies  SOCIAL HISTORY:  Social History  Substance Use Topics  . Smoking status: Never Smoker  . Smokeless tobacco: Never Used  . Alcohol use Yes     Comment: occasional    FAMILY HISTORY: No family history on file.  EXAM: BP 159/80 (BP Location: Right Arm)   Pulse (!) 122   Temp 98.5 F (36.9 C) (Oral)   Resp 25   LMP 10/12/2016   SpO2 100%  CONSTITUTIONAL: Alert and oriented and responds appropriately to questions. Well-appearing; well-nourished HEAD: Normocephalic EYES: Conjunctivae clear, PERRL, EOMI ENT: normal nose; no rhinorrhea; moist mucous membranes NECK: Supple, no meningismus, no nuchal rigidity, no LAD  CARD: Regular and tachycardic; S1 and S2 appreciated; no murmurs, no clicks, no rubs, no gallops RESP: Normal chest excursion without splinting or tachypnea; breath sounds clear and equal bilaterally; no wheezes, no rhonchi, no rales, no hypoxia or respiratory distress, speaking full sentences ABD/GI: Normal bowel sounds; non-distended; soft, non-tender, no rebound, no guarding, no peritoneal signs, no hepatosplenomegaly BACK:  The back appears normal and is non-tender to palpation, there is no CVA tenderness EXT: Normal ROM in all joints; non-tender to  palpation; no edema; normal capillary refill; no cyanosis, no calf tenderness or swelling    SKIN: Normal color for age and race; warm; no rash NEURO: Moves all extremities equally, sensation to light touch intact diffusely, cranial nerves II through XII intact, normal speech PSYCH: The patient's mood and manner are appropriate. Grooming and personal hygiene are appropriate.  MEDICAL  DECISION MAKING: Patient here chest pain, palpitations. She is tachycardic but no shortness of breath, hypoxia or tachypnea. Lungs are clear to auscultation. EKG shows no ischemic abnormality, interval changes or arrhythmia. We'll obtain labs, urine, chest x-ray. We'll give IV fluids and reassess. No arrhythmia per EMS.  ED PROGRESS: 2:05 AM  Labs are unremarkable. Potassium 3.9, magnesium 1.9. Troponin negative. D-dimer negative. She is not pregnant. We'll continue to monitor her heart rate after hydration.   3:40 AM  Pt's second troponin is negative and has gone down. She is still tachycardic into the 130s at rest. He states her chest pressure has improved. I do not feel we have successfully ruled out a pulmonary embolus or other acute abnormality given her heart rate is still so fast and she has had normal heart rates in the past. Denies increased caffeine intake or drug use. Urine drug screen today was negative. Plan is to obtain a CT of her chest for further vibration, give second liter of IV fluids. Repeat oral temperature is 98.4. We'll obtain a rectal temperature.   5:00 AM  Pt's CT scan shows no pulmonary embolus. Heart rate is now in the 90s at rest. Rectal temperature normal. She has no chest pain or palpitations currently. Still in sinus rhythm. Receiving her second liter of IV fluids. I feel she is safe to be discharged with close outpatient follow-up. Discussed this with patient and mother who are comfortable with this plan. We'll get outpatient cardiology follow-up as well. Discussed return precautions. Have advised her to increase her water intake, avoid caffeine.   At this time, I do not feel there is any life-threatening condition present. I have reviewed and discussed all results (EKG, imaging, lab, urine as appropriate) and exam findings with patient/family. I have reviewed nursing notes and appropriate previous records.  I feel the patient is safe to be discharged home without further  emergent workup and can continue workup as an outpatient as needed. Discussed usual and customary return precautions. Patient/family verbalize understanding and are comfortable with this plan.  Outpatient follow-up has been provided. All questions have been answered.   EKG Interpretation  Date/Time:  Sunday October 26 2016 23:47:13 EST Ventricular Rate:  115 PR Interval:  154 QRS Duration: 76 QT Interval:  334 QTC Calculation: 462 R Axis:   80 Text Interpretation:  Sinus tachycardia Otherwise normal ECG No significant change since last tracing other than rate is faster Confirmed by Joniya Boberg,  DO, Jazper Nikolai ([id number]) on 10/27/2016 12:10:15 AM        I personally performed the services described in this documentation, which was scribed in my presence. The recorded information has been reviewed and is accurate.    Layla MawKristen N Ariann Khaimov, DO 10/27/16 0502

## 2016-10-27 NOTE — Discharge Instructions (Signed)
I recommend you increase her water intake at home, avoid caffeine. Please follow-up with a primary care physician and cardiology. They may request that you wear a Holter monitor to see if there are any abnormal events, abnormal cardiac rhythms.   To find a primary care or specialty doctor please call [phone] or [phone] to access "[facility] Find a Doctor Service."  You may also go on the [facility] website at [email or web address]  There are also multiple [facility], [facility], [facility] and [facility] practices throughout [place] that are frequently accepting new patients. You may find a clinic that is close to your home and contact them.  [facility] -  [address] [place] [id number] [phone]   [facility] -  [address] [place] [id number] [phone]   [facility] [phone]  [place] [phone]

## 2016-12-21 ENCOUNTER — Inpatient Hospital Stay (HOSPITAL_COMMUNITY): Payer: Self-pay

## 2016-12-21 ENCOUNTER — Inpatient Hospital Stay (HOSPITAL_COMMUNITY)
Admission: EM | Admit: 2016-12-21 | Discharge: 2016-12-22 | DRG: 313 | Disposition: A | Discharge status: Home / Self Care | Payer: Self-pay | Attending: Internal Medicine | Admitting: Internal Medicine

## 2016-12-21 ENCOUNTER — Encounter (HOSPITAL_COMMUNITY): Payer: Self-pay | Admitting: Emergency Medicine

## 2016-12-21 DIAGNOSIS — I10 Essential (primary) hypertension: Secondary | ICD-10-CM | POA: Diagnosis present

## 2016-12-21 DIAGNOSIS — R079 Chest pain, unspecified: Secondary | ICD-10-CM | POA: Diagnosis present

## 2016-12-21 DIAGNOSIS — R0789 Other chest pain: Principal | ICD-10-CM | POA: Diagnosis present

## 2016-12-21 DIAGNOSIS — R9431 Abnormal electrocardiogram [ECG] [EKG]: Secondary | ICD-10-CM | POA: Diagnosis present

## 2016-12-21 DIAGNOSIS — R946 Abnormal results of thyroid function studies: Secondary | ICD-10-CM | POA: Diagnosis present

## 2016-12-21 DIAGNOSIS — R002 Palpitations: Secondary | ICD-10-CM

## 2016-12-21 DIAGNOSIS — E876 Hypokalemia: Secondary | ICD-10-CM | POA: Diagnosis present

## 2016-12-21 DIAGNOSIS — E663 Overweight: Secondary | ICD-10-CM | POA: Diagnosis present

## 2016-12-21 DIAGNOSIS — Z8249 Family history of ischemic heart disease and other diseases of the circulatory system: Secondary | ICD-10-CM

## 2016-12-21 DIAGNOSIS — Z6837 Body mass index (BMI) 37.0-37.9, adult: Secondary | ICD-10-CM

## 2016-12-21 DIAGNOSIS — Z79899 Other long term (current) drug therapy: Secondary | ICD-10-CM

## 2016-12-21 DIAGNOSIS — R000 Tachycardia, unspecified: Secondary | ICD-10-CM | POA: Diagnosis present

## 2016-12-21 HISTORY — DX: Essential (primary) hypertension: I10

## 2016-12-21 LAB — I-STAT TROPONIN, ED: Troponin i, poc: 0 ng/mL (ref 0.00–0.08)

## 2016-12-21 LAB — CBC WITH DIFFERENTIAL/PLATELET
BASOS ABS: 0.1 10*3/uL (ref 0.0–0.1)
BASOS PCT: 1 %
EOS PCT: 3 %
Eosinophils Absolute: 0.2 10*3/uL (ref 0.0–0.7)
HCT: 39.8 % (ref 36.0–46.0)
Hemoglobin: 13.2 g/dL (ref 12.0–15.0)
Lymphocytes Relative: 29 %
Lymphs Abs: 2.3 10*3/uL (ref 0.7–4.0)
MCH: 26 pg (ref 26.0–34.0)
MCHC: 33.2 g/dL (ref 30.0–36.0)
MCV: 78.3 fL (ref 78.0–100.0)
MONO ABS: 0.6 10*3/uL (ref 0.1–1.0)
Monocytes Relative: 7 %
NEUTROS ABS: 4.7 10*3/uL (ref 1.7–7.7)
Neutrophils Relative %: 60 %
PLATELETS: 182 10*3/uL (ref 150–400)
RBC: 5.08 MIL/uL (ref 3.87–5.11)
RDW: 14.8 % (ref 11.5–15.5)
WBC: 7.8 10*3/uL (ref 4.0–10.5)

## 2016-12-21 LAB — ECHOCARDIOGRAM COMPLETE
CHL CUP DOP CALC LVOT VTI: 20 cm
CHL CUP MV DEC (S): 201
E decel time: 201 msec
EERAT: 3.93
FS: 33 % (ref 28–44)
IVS/LV PW RATIO, ED: 1.03
LA ID, A-P, ES: 25 mm
LA diam end sys: 25 mm
LA vol A4C: 25 ml
LA vol index: 12.5 mL/m2
LADIAMINDEX: 1.18 cm/m2
LAVOL: 26.4 mL
LV PW d: 8.46 mm — AB (ref 0.6–1.1)
LV TDI E'LATERAL: 20
LV e' LATERAL: 20 cm/s
LVEEAVG: 3.93
LVEEMED: 3.93
LVOT area: 2.27 cm2
LVOT diameter: 17 mm
LVOT peak grad rest: 6 mmHg
LVOTPV: 122 cm/s
LVOTSV: 45 mL
MV pk A vel: 68.4 m/s
MVPG: 2 mmHg
MVPKEVEL: 78.6 m/s
RV LATERAL S' VELOCITY: 16.2 cm/s
RV TAPSE: 25.4 mm
TDI e' medial: 12.4
Weight: 3673.6 oz

## 2016-12-21 LAB — CBC
HEMATOCRIT: 37.3 % (ref 36.0–46.0)
HEMOGLOBIN: 12 g/dL (ref 12.0–15.0)
MCH: 25.2 pg — ABNORMAL LOW (ref 26.0–34.0)
MCHC: 32.2 g/dL (ref 30.0–36.0)
MCV: 78.4 fL (ref 78.0–100.0)
Platelets: 163 10*3/uL (ref 150–400)
RBC: 4.76 MIL/uL (ref 3.87–5.11)
RDW: 14.8 % (ref 11.5–15.5)
WBC: 7.3 10*3/uL (ref 4.0–10.5)

## 2016-12-21 LAB — RAPID URINE DRUG SCREEN, HOSP PERFORMED
Amphetamines: NOT DETECTED
BENZODIAZEPINES: NOT DETECTED
Barbiturates: NOT DETECTED
COCAINE: NOT DETECTED
OPIATES: NOT DETECTED
Tetrahydrocannabinol: NOT DETECTED

## 2016-12-21 LAB — TROPONIN I
Troponin I: <0.03 ng/mL (ref <0.03)
Troponin I: <0.03 ng/mL (ref <0.03)

## 2016-12-21 LAB — COMPREHENSIVE METABOLIC PANEL
ALBUMIN: 4.1 g/dL (ref 3.5–5.0)
ALT: 12 U/L — ABNORMAL LOW (ref 14–54)
AST: 17 U/L (ref 15–41)
Alkaline Phosphatase: 75 U/L (ref 38–126)
Anion gap: 14 (ref 5–15)
BUN: 7 mg/dL (ref 6–20)
CALCIUM: 9.5 mg/dL (ref 8.9–10.3)
CHLORIDE: 103 mmol/L (ref 101–111)
CO2: 23 mmol/L (ref 22–32)
CREATININE: 0.89 mg/dL (ref 0.44–1.00)
GFR calc Af Amer: >60 mL/min (ref >60)
GFR calc non Af Amer: >60 mL/min (ref >60)
Glucose, Bld: 124 mg/dL — ABNORMAL HIGH (ref 65–99)
POTASSIUM: 2.9 mmol/L — AB (ref 3.5–5.1)
Sodium: 140 mmol/L (ref 135–145)
TOTAL PROTEIN: 7.3 g/dL (ref 6.5–8.1)
Total Bilirubin: 0.6 mg/dL (ref 0.3–1.2)

## 2016-12-21 LAB — CREATININE, SERUM
Creatinine, Ser: 0.75 mg/dL (ref 0.44–1.00)
GFR calc Af Amer: >60 mL/min (ref >60)
GFR calc non Af Amer: >60 mL/min (ref >60)

## 2016-12-21 LAB — I-STAT BETA HCG BLOOD, ED (MC, WL, AP ONLY): I-stat hCG, quantitative: <5 m[IU]/mL (ref <5)

## 2016-12-21 LAB — PHOSPHORUS: PHOSPHORUS: 1.7 mg/dL — AB (ref 2.5–4.6)

## 2016-12-21 LAB — MAGNESIUM: MAGNESIUM: 1.7 mg/dL (ref 1.7–2.4)

## 2016-12-21 LAB — TSH: TSH: 1.99 u[IU]/mL (ref 0.350–4.500)

## 2016-12-21 MED ORDER — METOPROLOL TARTRATE 5 MG/5ML IV SOLN: 5.0000 mg | Freq: Three times a day (TID) | INTRAVENOUS | Status: DC | PRN

## 2016-12-21 MED ORDER — ENOXAPARIN SODIUM 40 MG/0.4ML ~~LOC~~ SOLN
40.0000 mg | SUBCUTANEOUS | Status: DC
  Administered 2016-12-21 – 2016-12-22 (×2): 40 mg via SUBCUTANEOUS
  Filled 2016-12-21 (×2): qty 0.4

## 2016-12-21 MED ORDER — ASPIRIN 81 MG PO CHEW
324.0000 mg | CHEWABLE_TABLET | Freq: Once | ORAL | Status: AC
Start: 2016-12-21 — End: 2016-12-21
  Administered 2016-12-21: 324 mg via ORAL
  Filled 2016-12-21: qty 4

## 2016-12-21 MED ORDER — ONDANSETRON HCL 4 MG/2ML IJ SOLN
4.0000 mg | Freq: Four times a day (QID) | INTRAMUSCULAR | Status: DC | PRN
  Administered 2016-12-22: 4 mg via INTRAVENOUS
  Filled 2016-12-21: qty 2

## 2016-12-21 MED ORDER — POTASSIUM CHLORIDE CRYS ER 20 MEQ PO TBCR
60.0000 meq | EXTENDED_RELEASE_TABLET | Freq: Once | ORAL | Status: AC
  Administered 2016-12-21: 60 meq via ORAL
  Filled 2016-12-21: qty 3

## 2016-12-21 MED ORDER — MORPHINE SULFATE (PF) 4 MG/ML IV SOLN: 2.0000 mg | INTRAVENOUS | Status: DC | PRN

## 2016-12-21 MED ORDER — ASPIRIN EC 325 MG PO TBEC: 325.0000 mg | DELAYED_RELEASE_TABLET | Freq: Every day | ORAL | Status: DC

## 2016-12-21 MED ORDER — SODIUM CHLORIDE 0.9 % IV SOLN
30.0000 meq | Freq: Once | INTRAVENOUS | Status: AC
  Administered 2016-12-21: 30 meq via INTRAVENOUS
  Filled 2016-12-21 (×2): qty 15

## 2016-12-21 MED ORDER — SODIUM CHLORIDE 0.9 % IV BOLUS (SEPSIS)
1000.0000 mL | Freq: Once | INTRAVENOUS | Status: AC
  Administered 2016-12-21: 1000 mL via INTRAVENOUS

## 2016-12-21 MED ORDER — CLONAZEPAM 0.5 MG PO TABS: 0.5000 mg | ORAL_TABLET | Freq: Two times a day (BID) | ORAL | Status: DC | PRN

## 2016-12-21 MED ORDER — ACETAMINOPHEN 325 MG PO TABS: 650.0000 mg | ORAL_TABLET | ORAL | Status: DC | PRN

## 2016-12-21 MED ORDER — HYDRALAZINE HCL 20 MG/ML IJ SOLN: 10.0000 mg | Freq: Three times a day (TID) | INTRAMUSCULAR | Status: DC | PRN

## 2016-12-21 MED ORDER — METOPROLOL TARTRATE 25 MG PO TABS
25.0000 mg | ORAL_TABLET | Freq: Two times a day (BID) | ORAL | Status: DC
  Administered 2016-12-21 – 2016-12-22 (×3): 25 mg via ORAL
  Filled 2016-12-21 (×3): qty 1

## 2016-12-21 MED ORDER — METOPROLOL TARTRATE 25 MG PO TABS: 25.0000 mg | ORAL_TABLET | Freq: Two times a day (BID) | ORAL | Status: DC

## 2016-12-21 MED ORDER — SODIUM CHLORIDE 0.9 % IV BOLUS (SEPSIS): 2000.0000 mL | Freq: Once | INTRAVENOUS | Status: DC

## 2016-12-21 MED ORDER — MAGNESIUM OXIDE 400 (241.3 MG) MG PO TABS
400.0000 mg | ORAL_TABLET | Freq: Once | ORAL | Status: AC
  Administered 2016-12-21: 400 mg via ORAL
  Filled 2016-12-21: qty 1

## 2016-12-21 MED ORDER — NITROGLYCERIN 0.4 MG SL SUBL: 0.4000 mg | SUBLINGUAL_TABLET | SUBLINGUAL | Status: DC | PRN

## 2016-12-21 NOTE — H&P (Signed)
[facility] History and Physical  Meredith Yoder:[id number] DOB: 1986-09-10 DOA: 12/21/2016  Referring physician:  PCP: Gwynneth Aliment, MD   Chief Complaint: "My heart has been racing."  HPI: Meredith Yoder is a 31 y.o. female  past medical history of heart murmur, tachycardia and hypertension presents to emergency room with a chief complaint of racing heartbeat and chest pressure. Patient states that starting on Thanksgiving she began to have odd heart and chest sensations. She will did have episodes where her heart would race intermittently. Patient presented to the emergency room for this issue and was cleared for discharge 11/27. Patient began to have difficulty sleeping at night often only getting 4 hours of sleep. Patient went to see her primary care doctor and was worked up for fast heart rate and high blood pressure. Patient's primary care doctor started her on Norvasc and Lopressor. Also place patient on a low-salt diet. Patient states she has lost 40 pounds since the diet change.  Patient is currently have an some chest pressure accompanied by some nausea that is intermittent.  Family history of hyperthyroidism Patient denies caffeine use No history of heart attack, arrhythmia or venous thrombo embolism  ED course: Normal troponin. Potassium repleted. Given 1 L fluid. Hospitaliist was consulted for admission.  Review of Systems:  As per HPI otherwise 10 point review of systems negative.    Past Medical History:  Diagnosis Date  . Hypertension   . Murmur    History reviewed. No pertinent surgical history. Social History:  reports that she has never smoked. She has never used smokeless tobacco. She reports that she drinks alcohol. She reports that she does not use drugs.  No Known Allergies  No family history on file.   Prior to Admission medications   Medication Sig Start Date End Date Taking? Authorizing Provider  acetaminophen (TYLENOL) 500 MG  tablet Take 1,000 mg by mouth every 6 (six) hours as needed for headache.    Yes Historical Provider, MD  amLODipine (NORVASC) 5 MG tablet Take 5 mg by mouth daily.   Yes Historical Provider, MD  famotidine (PEPCID) 20 MG tablet Take 20 mg by mouth 2 (two) times daily.   Yes Historical Provider, MD  ibuprofen (ADVIL,MOTRIN) 200 MG tablet Take 600 mg by mouth 2 (two) times daily as needed for headache or moderate pain.   Yes Historical Provider, MD  metoprolol tartrate (LOPRESSOR) 25 MG tablet Take 25 mg by mouth 2 (two) times daily.   Yes Historical Provider, MD   Physical Exam: Vitals:   12/21/16 0545 12/21/16 0600 12/21/16 0615 12/21/16 0715  BP: 146/97 140/83 134/88 137/87  Pulse: 120 109 109 112  Resp: 19 18 16 15   Temp:      TempSrc:      SpO2: 100% 99% 100% 100%    Wt Readings from Last 3 Encounters:  03/26/15 122.5 kg (270 lb)    General:  Appears calm and comfortable, Alert and oriented 3 Eyes:  PERRL, EOMI, normal lids, iris ENT:  grossly normal hearing, lips & tongue,   Neck:  no LAD, masses or thyromegaly, thyroid feels asymmetrical but nontender topalpation Cardiovascular:  Tachycardia, regular rhythm, no murmurs rubs or gallops Respiratory:  CTA bilaterally, no w/r/r. Normal respiratory effort. Abdomen:  soft, ntnd Skin:  no rash or induration seen on limited exam Musculoskeletal:  grossly normal tone BUE/BLE Psychiatric:  grossly normal mood and affect, speech fluent and appropriate Neurologic:  CN 2-12 grossly intact, moves all  extremities in coordinated fashion.          Labs on Admission:  Basic Metabolic Panel:  Recent Labs Lab 12/21/16 0503 12/21/16 0508  NA 140  --   K 2.9*  --   CL 103  --   CO2 23  --   GLUCOSE 124*  --   BUN 7  --   CREATININE 0.89  --   CALCIUM 9.5  --   MG  --  1.7   Liver Function Tests:  Recent Labs Lab 12/21/16 0503  AST 17  ALT 12*  ALKPHOS 75  BILITOT 0.6  PROT 7.3  ALBUMIN 4.1   No results for input(s):  LIPASE, AMYLASE in the last 168 hours. No results for input(s): AMMONIA in the last 168 hours. CBC:  Recent Labs Lab 12/21/16 0503  WBC 7.8  NEUTROABS 4.7  HGB 13.2  HCT 39.8  MCV 78.3  PLT 182   Cardiac Enzymes: No results for input(s): CKTOTAL, CKMB, CKMBINDEX, TROPONINI in the last 168 hours.  BNP (last 3 results) No results for input(s): BNP in the last 8760 hours.  ProBNP (last 3 results) No results for input(s): PROBNP in the last 8760 hours.   Creatinine clearance cannot be calculated (Unknown ideal weight.)  CBG: No results for input(s): GLUCAP in the last 168 hours.  Radiological Exams on Admission: No results found.  EKG: Independently reviewed. VR 139, PR 100, QRS 74, QTc 553, no STEMI-no acute ST changes when compared to previous EKG from November 2017  Assessment/Plan Principal Problem:   Chest pain Active Problems:   HTN (hypertension)   Tachycardia   Abnormal thyroid exam  CP Given hear score of 3 in ED. - serial trop ordered, initial neg - prn EKG CP - prn moprhine CP - prn ntg cp  - asa in ED and QD - ECHO ordered for AM - tele bed, cardiac monitoring - ambien for sleep prn - zofran prn for nausea CXR ordered CTA last when she presented was neg but a limited study but d-dimer was negative (on my review of past records)  Tachycardia Will monitor rate after BP meds Check TSH Cardio consult  Lytes imbalance 90meq of K replaced in ED Checking phos Recheck in AM  Hypertension When necessary hydralazine 10 mg IV as needed for severe blood pressure Cont lopressor, may need to be titrated up Hold norvasc  Code Status: FULL DVT Prophylaxis: Lovenox  Family Communication: parents at bedside Disposition Plan: Pending Improvement  Status: obs tele  Haydee SalterPhillip M Syla Devoss, MD Family Medicine [facility] [email or web address] Password [email or web address]

## 2016-12-21 NOTE — ED Notes (Signed)
hospitalist at bedside

## 2016-12-21 NOTE — Progress Notes (Signed)
  Echocardiogram 2D Echocardiogram has been performed.  Meredith Yoder, Meredith Yoder 12/21/2016, 5:35 PM

## 2016-12-21 NOTE — ED Notes (Signed)
Patient at u/s 

## 2016-12-21 NOTE — ED Provider Notes (Signed)
[facility]EM[facility]ENCY DEPT Provider Note   CSN: [id number] Arrival date & time: 12/21/16  0446     History   Chief Complaint Chief Complaint  Patient presents with  . Hypertension    Tachycardic    HPI Meredith Yoder is a 31 y.o. female.PMH of HTN here with worsening chest pain.  Patient states this all started about 1 month ago and it is pressurei n the center of the chest.  She has palpitations, SOB and nausea.  No diaphoresis.  She had seen her PCP who placed her on amlodipine and metoprolol which has not improved her symptoms.  She has been tested for PE, thyroid disorder and heart attack and all have been negative.  She is now becoming more weak with her symptoms.  Nothing has made it better. Exertion makes it worse.  There are no further complaints.  10 Systems reviewed and are negative for acute change except as noted in the HPI.   HPI  Past Medical History:  Diagnosis Date  . Hypertension   . Murmur     Patient Active Problem List   Diagnosis Date Noted  . Chest pain 12/21/2016    History reviewed. No pertinent surgical history.  OB History    No data available       Home Medications    Prior to Admission medications   Medication Sig Start Date End Date Taking? Authorizing Provider  acetaminophen (TYLENOL) 500 MG tablet Take 1,000 mg by mouth every 6 (six) hours as needed for headache.    Yes Historical Provider, MD  amLODipine (NORVASC) 5 MG tablet Take 5 mg by mouth daily.   Yes Historical Provider, MD  famotidine (PEPCID) 20 MG tablet Take 20 mg by mouth 2 (two) times daily.   Yes Historical Provider, MD  ibuprofen (ADVIL,MOTRIN) 200 MG tablet Take 600 mg by mouth 2 (two) times daily as needed for headache or moderate pain.   Yes Historical Provider, MD  metoprolol tartrate (LOPRESSOR) 25 MG tablet Take 25 mg by mouth 2 (two) times daily.   Yes Historical Provider, MD    Family History No family history on file.  Social History Social History    Substance Use Topics  . Smoking status: Never Smoker  . Smokeless tobacco: Never Used  . Alcohol use Yes     Comment: occasional     Allergies   Patient has no known allergies.   Review of Systems Review of Systems   Physical Exam Updated Vital Signs BP 134/88   Pulse 109   Temp 98.8 F (37.1 C) (Oral)   Resp 16   LMP 12/10/2016   SpO2 100%   Physical Exam  Constitutional: She is oriented to person, place, and time. She appears well-developed and well-nourished. No distress.  HENT:  Head: Normocephalic and atraumatic.  Nose: Nose normal.  Mouth/Throat: Oropharynx is clear and moist. No oropharyngeal exudate.  Eyes: Conjunctivae and EOM are normal. Pupils are equal, round, and reactive to light. No scleral icterus.  Neck: Normal range of motion. Neck supple. No JVD present. No tracheal deviation present. No thyromegaly present.  Cardiovascular: Regular rhythm and normal heart sounds.  Exam reveals no gallop and no friction rub.   No murmur heard. tachycardic  Pulmonary/Chest: Effort normal and breath sounds normal. No respiratory distress. She has no wheezes. She exhibits no tenderness.  Abdominal: Soft. Bowel sounds are normal. She exhibits no distension and no mass. There is no tenderness. There is no rebound and no guarding.  Musculoskeletal: Normal range of motion. She exhibits no edema or tenderness.  Lymphadenopathy:    She has no cervical adenopathy.  Neurological: She is alert and oriented to person, place, and time. No cranial nerve deficit. She exhibits normal muscle tone.  Skin: Skin is warm and dry. No rash noted. No erythema. No pallor.  Nursing note and vitals reviewed.    ED Treatments / Results  Labs (all labs ordered are listed, but only abnormal results are displayed) Labs Reviewed  COMPREHENSIVE METABOLIC PANEL - Abnormal; Notable for the following:       Result Value   Potassium 2.9 (*)    Glucose, Bld 124 (*)    ALT 12 (*)    All other  components within normal limits  CBC WITH DIFFERENTIAL/PLATELET  MAGNESIUM  CALCIUM, IONIZED  I-STAT BETA HCG BLOOD, ED ([facility], [facility], [facility] ONLY)  I-STAT TROPOININ, ED    EKG  EKG Interpretation  Date/Time:  Sunday December 21 2016 05:03:16 EST Ventricular Rate:  139 PR Interval:    QRS Duration: 74 QT Interval:  364 QTC Calculation: 553 R Axis:   80 Text Interpretation:  Critical Test Result: Long QTc Sinus tachycardia ST & T wave abnormality, consider inferior ischemia Abnormal ECG Qtc is prolonged Confirmed by Erroll Luna [phone]) on 12/21/2016 5:58:15 AM       Radiology No results found.  Procedures Procedures (including critical care time)  Medications Ordered in ED Medications  sodium chloride 0.9 % bolus 1,000 mL (not administered)  potassium chloride 30 mEq in sodium chloride 0.9 % 265 mL (KCL MULTIRUN) IVPB (not administered)  potassium chloride SA (K-DUR,KLOR-CON) CR tablet 60 mEq (60 mEq Oral Given 12/21/16 6045)     Initial Impression / Assessment and Plan / ED Course  I have reviewed the triage vital signs and the nursing notes.  Pertinent labs & imaging results that were available during my care of the patient were reviewed by me and considered in my medical decision making (see chart for details).     Patient presents to the ED for palpitations and chest pain.  Her history is concerning for cardiac nature.  EKG is sinus tach with prolonged Qt.  This may be due to low potassium.  I do not see any other abnormalities.  Plan to admit for rule out and cardiology consultation.  HR coninutes to stay in the 120. In the ED.  Final Clinical Impressions(s) / ED Diagnoses   Final diagnoses:  Tachycardia  Chest pain, unspecified type  Hypertension, unspecified type    New Prescriptions New Prescriptions   No medications on file     Tomasita Crumble, MD 12/21/16 [phone]

## 2016-12-21 NOTE — ED Triage Notes (Addendum)
Pt. reports hypertension this morning BP= 153/108 at home with intermittent nausea and fatigue , denies fever or chills . Tachycardic at triage 130-140's .

## 2016-12-21 NOTE — Progress Notes (Signed)
Patient has arrived on unit from ED Patient oriented to unit, assessed, placed on tele, VS were stable with tachycardia.

## 2016-12-21 NOTE — ED Notes (Signed)
Pt to US, will take patient upstairs when she returns

## 2016-12-21 NOTE — Consult Note (Signed)
Cardiology Consult    Patient ID: Meredith ClementCharlotte M Yoder MRN: [id number], DOB/AGE: 03/30/1986   Admit date: 12/21/2016 Date of Consult: 12/21/2016  Primary Physician: Gwynneth Alimentobyn N Sanders, MD Primary Cardiologist: New Requesting Provider: Dr. Melynda RippleHobbs Reason for Consultation: ST, chest pressure  Patient Profile    31 yo female with PMH of HTN, tachycardia who presented to the ED with reports of chest pressure, nausea and palpitations.   Past Medical History   Past Medical History:  Diagnosis Date  . Hypertension   . Murmur     History reviewed. No pertinent surgical history.   Allergies  No Known Allergies  History of Present Illness    Meredith Yoder is a 31 yo female with PMH of HTN, tachycardia. Reports she was seen back around Thanksgiving when she developed palpitations and feeling like her heart was racing. States she was seen in the ED then, and followed up with her PCP. She was started on metoprolol, norvasc and pepcid. Also started on a low sodium diet, and has lost weight since diet changes.   Reports since this she has been doing pretty well, but continues to have episodes of chest pressure with palpitations, and nausea. States these are not associated with rest or activity, seem to come and go a random times. Nothing seems to make them better, just eventually subsides on its own. Also has been haviing trouble sleeping at night. States she has been sleeping in a reclining chair, because she develops that chest pressure when she is lying down at night.   She presented to the ED today with chest pressure and palpitations. In the ED her labs showed K+ 2.9, May 1.7, Hgb 13.2. Trop negx1. CXR negative. EKG showed ST with ST sagging in diffuse leads, prolonged QTc. Potassium replaced on the ED and she was started on mag.   Inpatient Medications    . aspirin  324 mg Oral Once  . [START ON 12/22/2016] aspirin EC  325 mg Oral Daily  . enoxaparin (LOVENOX) injection  40 mg Subcutaneous  Q24H  . magnesium oxide  400 mg Oral Once  . metoprolol tartrate  25 mg Oral BID  . potassium chloride (KCL MULTIRUN) 30 mEq in 265 mL IVPB  30 mEq Intravenous Once    Family History    Family History  Problem Relation Age of Onset  . Hypertension Father     Social History    Social History   Social History  . Marital status: Single    Spouse name: N/A  . Number of children: N/A  . Years of education: N/A   Occupational History  . Not on file.   Social History Main Topics  . Smoking status: Never Smoker  . Smokeless tobacco: Never Used  . Alcohol use Yes     Comment: occasional  . Drug use: No  . Sexual activity: Yes    Birth control/ protection: None   Other Topics Concern  . Not on file   Social History Narrative  . No narrative on file     Review of Systems    General:  No chills, fever, night sweats or weight changes.  Cardiovascular: See HPI Dermatological: No rash, lesions/masses Respiratory: No cough, dyspnea Abdominal:   ++ nausea, vomiting, diarrhea, bright red blood per rectum, melena, or hematemesis Neurologic:  No visual changes, wkns, changes in mental status. All other systems reviewed and are otherwise negative except as noted above.  Physical Exam    Blood pressure 123/63, pulse Marland Kitchen(!)  124, temperature 98.8 F (37.1 C), temperature source Oral, resp. rate 21, last menstrual period 12/10/2016, SpO2 99 %.  General: Pleasant young AAF, NAD Psych: Normal affect. Neuro: Alert and oriented X 3. Moves all extremities spontaneously. HEENT: Normal  Neck: Supple without bruits or JVD. Lungs:  Resp regular and unlabored, CTA. Heart: Tachy no s3, s4, or murmurs. Abdomen: Soft, non-tender, non-distended, BS + x 4.  Extremities: No clubbing, cyanosis or edema. DP/PT/Radials 2+ and equal bilaterally.  Labs    Troponin [facility] of Care Test)  Recent Labs  12/21/16 0624  TROPIPOC 0.00    Recent Labs  12/21/16 0843  TROPONINI <0.03   Lab  Results  Component Value Date   WBC 7.3 12/21/2016   HGB 12.0 12/21/2016   HCT 37.3 12/21/2016   MCV 78.4 12/21/2016   PLT 163 12/21/2016     Recent Labs Lab 12/21/16 0503 12/21/16 0843  NA 140  --   K 2.9*  --   CL 103  --   CO2 23  --   BUN 7  --   CREATININE 0.89 0.75  CALCIUM 9.5  --   PROT 7.3  --   BILITOT 0.6  --   ALKPHOS 75  --   ALT 12*  --   AST 17  --   GLUCOSE 124*  --    No results found for: CHOL, HDL, LDLCALC, TRIG Lab Results  Component Value Date   DDIMER 0.42 10/27/2016     Radiology Studies    Dg Chest 2 View  Result Date: 12/21/2016 CLINICAL DATA:  Chest pain and elevated heart rate. EXAM: CHEST  2 VIEW COMPARISON:  10/27/2016 FINDINGS: The heart size and mediastinal contours are within normal limits. Both lungs are clear. The visualized skeletal structures are unremarkable. IMPRESSION: No active cardiopulmonary disease. Electronically Signed   By: Signa Kell M.D.   On: 12/21/2016 09:43    ECG & Cardiac Imaging    EKG: ST with ST sagging in diffuse leads, prolonged QTc.  Echo: Pending   Assessment & Plan    31 yo female with PMH of HTN, tachycardia who presented to the ED with reports of chest pressure, nausea and palpitations.   1. Tachycardia: Reports this first occurred back after Thanksgiving and she was seen in the ED then. Followed up with her PCP and was placed on metoprolol 25mg  BID, norvasc 5mg  daily and pepcid. Reports this seems to help for a period of time, but symptoms have started to return. Not associated with rest or activity, and seems to resolve on its own.  -- Replace electrolytes, check echo for pericarditis as she does have chest pressure while laying at night.  -- consider checking a 24 hr urine, am cortisol level given her low potassium, and HTN as she has not had any n/v/d with this hypokalemia. Adrenal insufficiency?     2. HTN: Controlled with current medications   3. Hypokalemia: 2.9, Replaced per primary    Signed, Laverda Page, NP-C Pager [phone] 12/21/2016, 10:03 AM   I have seen, examined the patient, and reviewed the above assessment and plan.  On exam, overweight.  Tachy regular rhythm.   Changes to above are made where necessary.  The patient has gradual onset/offset tachycardia.  This appears to be sinus tachycardia and is likely secondary to another metabolic process.  Echo pending.  Check orthostatics.  Check urine metanephrine/ catechols.  Would also consider increased adrenal activity given hypertension, tachycardia, and hypokalemia.  If  echo is unrevealing, no further inpatient CV testing is required.  General cardiology to follow with you.  Co Sign: Hillis Range, MD 12/21/2016 12:25 PM

## 2016-12-21 NOTE — ED Notes (Signed)
Attempted report x 1. RN to call back. 

## 2016-12-22 DIAGNOSIS — R079 Chest pain, unspecified: Secondary | ICD-10-CM

## 2016-12-22 DIAGNOSIS — R946 Abnormal results of thyroid function studies: Secondary | ICD-10-CM

## 2016-12-22 LAB — ACTH STIMULATION, 3 TIME POINTS
CORTISOL 60 MIN: 26.8 ug/dL
Cortisol, 30 Min: 27.4 ug/dL
Cortisol, Base: 10.5 ug/dL

## 2016-12-22 LAB — BASIC METABOLIC PANEL
Anion gap: 9 (ref 5–15)
CO2: 23 mmol/L (ref 22–32)
Calcium: 9.2 mg/dL (ref 8.9–10.3)
Chloride: 108 mmol/L (ref 101–111)
Creatinine, Ser: 0.69 mg/dL (ref 0.44–1.00)
GFR calc Af Amer: >60 mL/min (ref >60)
GLUCOSE: 87 mg/dL (ref 65–99)
Potassium: 3.1 mmol/L — ABNORMAL LOW (ref 3.5–5.1)
Sodium: 140 mmol/L (ref 135–145)

## 2016-12-22 LAB — PHOSPHORUS: PHOSPHORUS: 2.4 mg/dL — AB (ref 2.5–4.6)

## 2016-12-22 LAB — CALCIUM, IONIZED: CALCIUM, IONIZED, SERUM: 5.1 mg/dL (ref 4.5–5.6)

## 2016-12-22 LAB — MAGNESIUM: MAGNESIUM: 1.9 mg/dL (ref 1.7–2.4)

## 2016-12-22 MED ORDER — COSYNTROPIN 0.25 MG IJ SOLR
0.2500 mg | Freq: Once | INTRAMUSCULAR | Status: AC
  Administered 2016-12-22: 0.25 mg via INTRAVENOUS
  Filled 2016-12-22: qty 0.25

## 2016-12-22 MED ORDER — METOPROLOL TARTRATE 25 MG PO TABS: 12.5000 mg | ORAL_TABLET | Freq: Two times a day (BID) | ORAL | 0 refills | Status: DC

## 2016-12-22 MED ORDER — POTASSIUM CHLORIDE ER 20 MEQ PO TBCR: 20.0000 meq | EXTENDED_RELEASE_TABLET | Freq: Every day | ORAL | 0 refills | Status: DC

## 2016-12-22 MED ORDER — POTASSIUM CHLORIDE CRYS ER 20 MEQ PO TBCR
40.0000 meq | EXTENDED_RELEASE_TABLET | Freq: Once | ORAL | Status: AC
  Administered 2016-12-22: 40 meq via ORAL
  Filled 2016-12-22: qty 2

## 2016-12-22 NOTE — Progress Notes (Signed)
Patient discharged teaching given including activity, diet, follow-up appointments and medication. Patient verbalized understanding of all discharge instructions. IV access was dc'd. Vitals are stable. Pt to be escorted out by NT, to be driven home by family. 

## 2016-12-22 NOTE — Progress Notes (Signed)
Progress Note  Patient Name: Meredith Yoder Date of Encounter: 12/22/2016  Primary Cardiologist: New (seen by Dr. Johney FrameAllred in Consult)  Subjective   Feels better. No complaints.   Inpatient Medications    Scheduled Meds: . [START ON 12/23/2016] cosyntropin  0.25 mg Intravenous Once  . enoxaparin (LOVENOX) injection  40 mg Subcutaneous Q24H  . metoprolol tartrate  25 mg Oral BID   Continuous Infusions:  PRN Meds: acetaminophen, clonazePAM, hydrALAZINE, metoprolol, morphine injection, nitroGLYCERIN, ondansetron (ZOFRAN) IV   Vital Signs    Vitals:   12/21/16 1401 12/21/16 1954 12/21/16 2008 12/22/16 0500  BP: 123/73 (!) 141/76  134/88  Pulse: 95 (!) 111  (!) 108  Resp: 20 18  18   Temp: 98.3 F (36.8 C) 98.4 F (36.9 C)  97.5 F (36.4 C)  TempSrc: Oral Oral  Oral  SpO2: 99% 100%  100%  Weight:      Height:   5\' 6"  (1.676 m)     Intake/Output Summary (Last 24 hours) at 12/22/16 0912 Last data filed at 12/21/16 1100  Gross per 24 hour  Intake              240 ml  Output                0 ml  Net              240 ml   Filed Weights   12/21/16 1032  Weight: 229 lb 9.6 oz (104.1 kg)    Telemetry    NSR 90s- Personally Reviewed Brief episode of sinus tach in the 120s overnight.    Physical Exam   GEN: No acute distress.  Neck: No JVD Cardiac: RRR, no murmurs, rubs, or gallops.  Respiratory: Clear to auscultation bilaterally. GI: Soft, nontender, non-distended  MS: No edema; No deformity. Neuro:  AAOx3. Psych: Normal affect  Labs    Chemistry Recent Labs Lab 12/21/16 0503 12/21/16 0843  NA 140  --   K 2.9*  --   CL 103  --   CO2 23  --   GLUCOSE 124*  --   BUN 7  --   CREATININE 0.89 0.75  CALCIUM 9.5  --   PROT 7.3  --   ALBUMIN 4.1  --   AST 17  --   ALT 12*  --   ALKPHOS 75  --   BILITOT 0.6  --   GFRNONAA >60 >60  GFRAA >60 >60  ANIONGAP 14  --      Hematology Recent Labs Lab 12/21/16 0503 12/21/16 0843  WBC 7.8 7.3    RBC 5.08 4.76  HGB 13.2 12.0  HCT 39.8 37.3  MCV 78.3 78.4  MCH 26.0 25.2*  MCHC 33.2 32.2  RDW 14.8 14.8  PLT 182 163    Cardiac Enzymes Recent Labs Lab 12/21/16 0843 12/21/16 1131 12/21/16 1429  TROPONINI <0.03 <0.03 <0.03    Recent Labs Lab 12/21/16 0624  TROPIPOC 0.00     BNPNo results for input(s): BNP, PROBNP in the last 168 hours.   DDimer No results for input(s): DDIMER in the last 168 hours.   Radiology    Dg Chest 2 View  Result Date: 12/21/2016 CLINICAL DATA:  Chest pain and elevated heart rate. EXAM: CHEST  2 VIEW COMPARISON:  10/27/2016 FINDINGS: The heart size and mediastinal contours are within normal limits. Both lungs are clear. The visualized skeletal structures are unremarkable. IMPRESSION: No active cardiopulmonary disease. Electronically Signed  By: Signa Kell M.D.   On: 12/21/2016 09:43   US Thyroid  Result Date: 12/21/2016 CLINICAL DATA:  Palpable abnormality. Abnormal physical exam of the thyroid gland. EXAM: THYROID ULTRASOUND TECHNIQUE: Ultrasound examination of the thyroid gland and adjacent soft tissues was performed. COMPARISON:  None. FINDINGS: Parenchymal Echotexture: Normal Isthmus: 0.3 cm. Right lobe: 5.6 x 0.9 x 2.0 cm. Left lobe: 5.3 x 1.1 x 1.3 cm. _________________________________________________________ Estimated total number of nodules >/= 1 cm: 0 Number of spongiform nodules >/=  2 cm not described below (TR1): 0 Number of mixed cystic and solid nodules >/= 1.5 cm not described below (TR2): 0 No discrete nodules are seen within the thyroid gland. IMPRESSION: Thyroid gland is unremarkable. The above is in keeping with the ACR TI-RADS recommendations - J Am Coll Radiol 2017;14:587-595. Electronically Signed   By: Jolaine Click M.D.   On: 12/21/2016 14:06    Cardiac Studies   2D echo 12/21/16 Study Conclusions  - Left ventricle: The cavity size was normal. Systolic function was   vigorous. The estimated ejection fraction was in  the range of 65%   to 70%. Wall motion was normal; there were no regional wall   motion abnormalities. Left ventricular diastolic function   parameters were normal.    Patient Profile     31 yo female with PMH of HTN, tachycardia who presented to the ED with reports of chest pressure, nausea and palpitations.   Assessment & Plan     1. Tachycardia: The patient has gradual onset/offset tachycardia. This appears to be sinus tachycardia and is likely secondary to another metabolic process. She was hypokalemic on admit with initial K of 2.9. 2D echo shows vigorous LVEF at 65-70% with normal wall motion. Cardiac enzymes are negative x 3. TSH normal. Urine metanephrine/ catechols pending.  Would also consider increased adrenal activity given hypertension, tachycardia, and hypokalemia. Given normal echo, no further inpatient CV testing is indicated at this time. She is currently in NSR. Can continue metoprolol.   3. HTN: BP is controlled.   3. Hypokalemia: K was 2.9 on admit. Supplemental K was given. Mg was WNL at 1.7. Repeat BMP today to ensure adequate repletion.    Signed, Robbie Lis, PA-C  12/22/2016, 9:12 AM    Reviewed chart. Agreed with note above. Reviewed Dr. Johney Frame note.   Donato Schultz, MD

## 2016-12-22 NOTE — Discharge Instructions (Signed)
Hypertension Hypertension, commonly called high blood pressure, is when the force of blood pumping through your arteries is too strong. Your arteries are the blood vessels that carry blood from your heart throughout your body. A blood pressure reading consists of a higher number over a lower number, such as 110/72. The higher number (systolic) is the pressure inside your arteries when your heart pumps. The lower number (diastolic) is the pressure inside your arteries when your heart relaxes. Ideally you want your blood pressure below 120/80. Hypertension forces your heart to work harder to pump blood. Your arteries may become narrow or stiff. Having untreated or uncontrolled hypertension can cause heart attack, stroke, kidney disease, and other problems. What increases the risk? Some risk factors for high blood pressure are controllable. Others are not. Risk factors you cannot control include:  Race. You may be at higher risk if you are African American.  Age. Risk increases with age.  Gender. Men are at higher risk than women before age 5 years. After age 42, women are at higher risk than men. Risk factors you can control include:  Not getting enough exercise or physical activity.  Being overweight.  Getting too much fat, sugar, calories, or salt in your diet.  Drinking too much alcohol. What are the signs or symptoms? Hypertension does not usually cause signs or symptoms. Extremely high blood pressure (hypertensive crisis) may cause headache, anxiety, shortness of breath, and nosebleed. How is this diagnosed? To check if you have hypertension, your health care provider will measure your blood pressure while you are seated, with your arm held at the level of your heart. It should be measured at least twice using the same arm. Certain conditions can cause a difference in blood pressure between your right and left arms. A blood pressure reading that is higher than normal on one occasion does  not mean that you need treatment. If it is not clear whether you have high blood pressure, you may be asked to return on a different day to have your blood pressure checked again. Or, you may be asked to monitor your blood pressure at home for 1 or more weeks. How is this treated? Treating high blood pressure includes making lifestyle changes and possibly taking medicine. Living a healthy lifestyle can help lower high blood pressure. You may need to change some of your habits. Lifestyle changes may include:  Following the DASH diet. This diet is high in fruits, vegetables, and whole grains. It is low in salt, red meat, and added sugars.  Keep your sodium intake below 2,300 mg per day.  Getting at least 30-45 minutes of aerobic exercise at least 4 times per week.  Losing weight if necessary.  Not smoking.  Limiting alcoholic beverages.  Learning ways to reduce stress. Your health care provider may prescribe medicine if lifestyle changes are not enough to get your blood pressure under control, and if one of the following is true:  You are 76-39 years of age and your systolic blood pressure is above 140.  You are 22 years of age or older, and your systolic blood pressure is above 150.  Your diastolic blood pressure is above 90.  You have diabetes, and your systolic blood pressure is over 161 or your diastolic blood pressure is over 90.  You have kidney disease and your blood pressure is above 140/90.  You have heart disease and your blood pressure is above 140/90. Your personal target blood pressure may vary depending on your medical  conditions, your age, and other factors. Follow these instructions at home:  Have your blood pressure rechecked as directed by your health care provider.  Take medicines only as directed by your health care provider. Follow the directions carefully. Blood pressure medicines must be taken as prescribed. The medicine does not work as well when you skip  doses. Skipping doses also puts you at risk for problems.  Do not smoke.  Monitor your blood pressure at home as directed by your health care provider. Contact a health care provider if:  You think you are having a reaction to medicines taken.  You have recurrent headaches or feel dizzy.  You have swelling in your ankles.  You have trouble with your vision. Get help right away if:  You develop a severe headache or confusion.  You have unusual weakness, numbness, or feel faint.  You have severe chest or abdominal pain.  You vomit repeatedly.  You have trouble breathing. This information is not intended to replace advice given to you by your health care provider. Make sure you discuss any questions you have with your health care provider. Document Released: 11/17/2005 Document Revised: 04/24/2016 Document Reviewed: 09/09/2013 Elsevier Interactive Patient Education  2017 [employer]  Hypokalemia Hypokalemia means that the amount of potassium in the blood is lower than normal.Potassium is a chemical that helps regulate the amount of fluid in the body (electrolyte). It also stimulates muscle tightening (contraction) and helps nerves work properly.Normally, most of the bodys potassium is inside of cells, and only a very small amount is in the blood. Because the amount in the blood is so small, minor changes to potassium levels in the blood can be life-threatening. What are the causes? This condition may be caused by:  Antibiotic medicine.  Diarrhea or vomiting. Taking too much of a medicine that helps you have a bowel movement (laxative) can cause diarrhea and lead to hypokalemia.  Chronic kidney disease (CKD).  Medicines that help the body get rid of excess fluid (diuretics).  Eating disorders, such as bulimia.  Low magnesium levels in the body.  Sweating a lot. What are the signs or symptoms? Symptoms of this condition  include:  Weakness.  Constipation.  Fatigue.  Muscle cramps.  Mental confusion.  Skipped heartbeats or irregular heartbeat (palpitations).  Tingling or numbness. How is this diagnosed? This condition is diagnosed with a blood test. How is this treated? Hypokalemia can be treated by taking potassium supplements by mouth or adjusting the medicines that you take. Treatment may also include eating more foods that contain a lot of potassium. If your potassium level is very low, you may need to get potassium through an IV tube in one of your veins and be monitored in the hospital. Follow these instructions at home:  Take over-the-counter and prescription medicines only as told by your health care provider. This includes vitamins and supplements.  Eat a healthy diet. A healthy diet includes fresh fruits and vegetables, whole grains, healthy fats, and lean proteins.  If instructed, eat more foods that contain a lot of potassium, such as:  Nuts, such as peanuts and pistachios.  Seeds, such as sunflower seeds and pumpkin seeds.  Peas, lentils, and lima beans.  Whole grain and bran cereals and breads.  Fresh fruits and vegetables, such as apricots, avocado, bananas, cantaloupe, kiwi, oranges, tomatoes, asparagus, and potatoes.  Orange juice.  Tomato juice.  Red meats.  Yogurt.  Keep all follow-up visits as told by your health care provider. This  is important. Contact a health care provider if:  You have weakness that gets worse.  You feel your heart pounding or racing.  You vomit.  You have diarrhea.  You have diabetes (diabetes mellitus) and you have trouble keeping your blood sugar (glucose) in your target range. Get help right away if:  You have chest pain.  You have shortness of breath.  You have vomiting or diarrhea that lasts for more than 2 days.  You faint. This information is not intended to replace advice given to you by your health care provider. Make  sure you discuss any questions you have with your health care provider. Document Released: 11/17/2005 Document Revised: 07/05/2016 Document Reviewed: 07/05/2016 Elsevier Interactive Patient Education  2017 [employer].  

## 2016-12-22 NOTE — Discharge Summary (Addendum)
Physician Discharge Summary  Meredith Yoder ZOX:[id number] DOB: [birth year] DOA: 12/21/2016  PCP: Gwynneth Aliment, MD  Admit date: 12/21/2016 Discharge date: 12/22/2016  Recommendations for Outpatient Follow-up:  Will follow up with you on pending results at the time of discharge. Tests for adrenal insufficiency pending. Take potassium for 7 days on discharge. Follow up with PCP in 1 week and recheck potassium level.  Discharge Diagnoses:  Principal Problem:   Chest pain Active Problems:   HTN (hypertension)   Tachycardia   Abnormal thyroid exam    Discharge Condition: stable   Diet recommendation: as tolerated   History of present illness:   Per HPI  "31 y.o. female  past medical history of heart murmur, tachycardia and hypertension presents to emergency room with a chief complaint of racing heartbeat and chest pressure. Patient states that starting on Thanksgiving she began to have odd heart and chest sensations. She will did have episodes where her heart would race intermittently. Patient presented to the emergency room for this issue and was cleared for discharge 11/27. Patient began to have difficulty sleeping at night often only getting 4 hours of sleep. Patient went to see her primary care doctor and was worked up for fast heart rate and high blood pressure. Patient's primary care doctor started her on Norvasc and Lopressor. Also place patient on a low-salt diet. Patient states she has lost 40 pounds since the diet change. ED course: Normal troponin. Potassium repleted. Given 1 L fluid. Hospitaliist was consulted for admission."  Hospital Course:   Principal Problem:   Chest pain - ruled out ACS - Normal trop x 3 - Normal ECHO - No further work up required  Active Problems:   Hypokalemia - Suspected AI but norma CST (cosyntropin stim test) - Supplemented prior to D/C - Continue low dose potassium on discharge for 7 days    HTN (hypertension) - Resume metoprolol  12.5 mg BID, pt says 25 mg makes her very nauseous - Has Norvasc as well - UA catecholamines pending and talked to pt about follow up with her in regards to results - She wants to go home and prefers to get results once they are available but does not want to wait in hospital     Tachycardia, sinus - Resume metoprolol as noted above     Abnormal thyroid exam - Normal US thyroid   Signed:  Manson Passey, MD  [facility] 12/22/2016, 2:10 PM  Pager #: [phone]  Time spent in minutes: less than 30 minutes  Procedures:  ECHO - normal EF  Consultations:  Cardio   Discharge Exam: Vitals:   12/22/16 1058 12/22/16 1257  BP: 137/89 132/82  Pulse: 96 94  Resp:  20  Temp:  98 F (36.7 C)   Vitals:   12/21/16 2008 12/22/16 0500 12/22/16 1058 12/22/16 1257  BP:  134/88 137/89 132/82  Pulse:  (!) 108 96 94  Resp:  18  20  Temp:  97.5 F (36.4 C)  98 F (36.7 C)  TempSrc:  Oral  Oral  SpO2:  100%  100%  Weight:      Height: 5\' 6"  (1.676 m)       General: Pt is alert, follows commands appropriately, not in acute distress Cardiovascular: Regular rate and rhythm, S1/S2 + Respiratory: Clear to auscultation bilaterally, no wheezing, no crackles, no rhonchi Abdominal: Soft, non tender, non distended, bowel sounds +, no guarding Extremities: no edema, no cyanosis, pulses palpable bilaterally DP and PT Neuro:  Grossly nonfocal  Discharge Instructions  Discharge Instructions    Call MD for:  persistant nausea and vomiting    Complete by:  As directed    Call MD for:  redness, tenderness, or signs of infection (pain, swelling, redness, odor or green/yellow discharge around incision site)    Complete by:  As directed    Call MD for:  severe uncontrolled pain    Complete by:  As directed    Diet - low sodium heart healthy    Complete by:  As directed    Discharge instructions    Complete by:  As directed    Will follow up with you on pending results at the time of  discharge. Tests for adrenal insufficiency pending. Take potassium for 7 days on discharge. Follow up with PCP in 1 week and recheck potassium level.   Increase activity slowly    Complete by:  As directed      Allergies as of 12/22/2016   No Known Allergies     Medication List    STOP taking these medications   ibuprofen 200 MG tablet Commonly known as:  ADVIL,MOTRIN     TAKE these medications   acetaminophen 500 MG tablet Commonly known as:  TYLENOL Take 1,000 mg by mouth every 6 (six) hours as needed for headache.   amLODipine 5 MG tablet Commonly known as:  NORVASC Take 5 mg by mouth daily.   famotidine 20 MG tablet Commonly known as:  PEPCID Take 20 mg by mouth 2 (two) times daily.   metoprolol tartrate 25 MG tablet Commonly known as:  LOPRESSOR Take 0.5 tablets (12.5 mg total) by mouth 2 (two) times daily. What changed:  how much to take   Potassium Chloride ER 20 MEQ Tbcr Take 20 mEq by mouth daily.      Follow-up Information    Gwynneth Alimentobyn N Sanders, MD. Schedule an appointment as soon as possible for a visit.   Specialty:  Internal Medicine Contact information: [address] [place] [address] [phone]            The results of significant diagnostics from this hospitalization (including imaging, microbiology, ancillary and laboratory) are listed below for reference.    Significant Diagnostic Studies: Dg Chest 2 View  Result Date: 12/21/2016 CLINICAL DATA:  Chest pain and elevated heart rate. EXAM: CHEST  2 VIEW COMPARISON:  10/27/2016 FINDINGS: The heart size and mediastinal contours are within normal limits. Both lungs are clear. The visualized skeletal structures are unremarkable. IMPRESSION: No active cardiopulmonary disease. Electronically Signed   By: Signa Kellaylor  Stroud M.D.   On: 12/21/2016 09:43   [place] Thyroid  Result Date: 12/21/2016 CLINICAL DATA:  Palpable abnormality. Abnormal physical exam of the thyroid gland. EXAM:  THYROID ULTRASOUND TECHNIQUE: Ultrasound examination of the thyroid gland and adjacent soft tissues was performed. COMPARISON:  None. FINDINGS: Parenchymal Echotexture: Normal Isthmus: 0.3 cm. Right lobe: 5.6 x 0.9 x 2.0 cm. Left lobe: 5.3 x 1.1 x 1.3 cm. _________________________________________________________ Estimated total number of nodules >/= 1 cm: 0 Number of spongiform nodules >/=  2 cm not described below (TR1): 0 Number of mixed cystic and solid nodules >/= 1.5 cm not described below (TR2): 0 No discrete nodules are seen within the thyroid gland. IMPRESSION: Thyroid gland is unremarkable. The above is in keeping with the ACR TI-RADS recommendations - J Am Coll Radiol 2017;14:587-595. Electronically Signed   By: Jolaine ClickArthur  Hoss M.D.   On: 12/21/2016 14:06  Microbiology: No results found for this or any previous visit (from the past 240 hour(s)).   Labs: Basic Metabolic Panel:  Recent Labs Lab 12/21/16 0503 12/21/16 0508 12/21/16 0843 12/22/16 1029  NA 140  --   --  140  K 2.9*  --   --  3.1*  CL 103  --   --  108  CO2 23  --   --  23  GLUCOSE 124*  --   --  87  BUN 7  --   --  <5*  CREATININE 0.89  --  0.75 0.69  CALCIUM 9.5  --   --  9.2  MG  --  1.7  --  1.9  PHOS  --   --  1.7* 2.4*   Liver Function Tests:  Recent Labs Lab 12/21/16 0503  AST 17  ALT 12*  ALKPHOS 75  BILITOT 0.6  PROT 7.3  ALBUMIN 4.1   No results for input(s): LIPASE, AMYLASE in the last 168 hours. No results for input(s): AMMONIA in the last 168 hours. CBC:  Recent Labs Lab 12/21/16 0503 12/21/16 0843  WBC 7.8 7.3  NEUTROABS 4.7  --   HGB 13.2 12.0  HCT 39.8 37.3  MCV 78.3 78.4  PLT 182 163   Cardiac Enzymes:  Recent Labs Lab 12/21/16 0843 12/21/16 1131 12/21/16 1429  TROPONINI <0.03 <0.03 <0.03   BNP: BNP (last 3 results) No results for input(s): BNP in the last 8760 hours.  ProBNP (last 3 results) No results for input(s): PROBNP in the last 8760 hours.  CBG: No  results for input(s): GLUCAP in the last 168 hours.

## 2016-12-25 LAB — ALDOSTERONE + RENIN ACTIVITY W/ RATIO
ALDO / PRA Ratio: 5.3 (ref 0.0–30.0)
Aldosterone: 3.5 ng/dL (ref 0.0–30.0)
PRA LC/MS/MS: 0.665 ng/mL/hr (ref 0.167–5.380)

## 2017-01-09 ENCOUNTER — Ambulatory Visit: Payer: Self-pay | Admitting: Cardiovascular Disease

## 2017-03-05 ENCOUNTER — Encounter (HOSPITAL_COMMUNITY): Payer: Self-pay | Admitting: Emergency Medicine

## 2017-03-05 ENCOUNTER — Other Ambulatory Visit: Payer: Self-pay

## 2017-03-05 ENCOUNTER — Emergency Department (HOSPITAL_COMMUNITY)
Admission: EM | Admit: 2017-03-05 | Discharge: 2017-03-06 | Disposition: A | Discharge status: Home / Self Care | Payer: Self-pay | Attending: Emergency Medicine | Admitting: Emergency Medicine

## 2017-03-05 ENCOUNTER — Emergency Department (HOSPITAL_COMMUNITY): Payer: Self-pay

## 2017-03-05 DIAGNOSIS — R0789 Other chest pain: Secondary | ICD-10-CM | POA: Insufficient documentation

## 2017-03-05 DIAGNOSIS — I10 Essential (primary) hypertension: Secondary | ICD-10-CM | POA: Insufficient documentation

## 2017-03-05 DIAGNOSIS — R000 Tachycardia, unspecified: Secondary | ICD-10-CM | POA: Insufficient documentation

## 2017-03-05 DIAGNOSIS — F419 Anxiety disorder, unspecified: Secondary | ICD-10-CM | POA: Insufficient documentation

## 2017-03-05 DIAGNOSIS — Z79899 Other long term (current) drug therapy: Secondary | ICD-10-CM | POA: Insufficient documentation

## 2017-03-05 LAB — I-STAT TROPONIN, ED: TROPONIN I, POC: 0 ng/mL (ref 0.00–0.08)

## 2017-03-05 LAB — CBC
HCT: 36.8 % (ref 36.0–46.0)
Hemoglobin: 12 g/dL (ref 12.0–15.0)
MCH: 26.3 pg (ref 26.0–34.0)
MCHC: 32.6 g/dL (ref 30.0–36.0)
MCV: 80.5 fL (ref 78.0–100.0)
PLATELETS: 238 10*3/uL (ref 150–400)
RBC: 4.57 MIL/uL (ref 3.87–5.11)
RDW: 15.5 % (ref 11.5–15.5)
WBC: 8.6 10*3/uL (ref 4.0–10.5)

## 2017-03-05 LAB — HEPATIC FUNCTION PANEL
ALBUMIN: 4.2 g/dL (ref 3.5–5.0)
ALT: 10 U/L — ABNORMAL LOW (ref 14–54)
AST: 18 U/L (ref 15–41)
Alkaline Phosphatase: 71 U/L (ref 38–126)
BILIRUBIN TOTAL: 0.4 mg/dL (ref 0.3–1.2)
Total Protein: 7.3 g/dL (ref 6.5–8.1)

## 2017-03-05 LAB — BASIC METABOLIC PANEL
Anion gap: 9 (ref 5–15)
BUN: 7 mg/dL (ref 6–20)
CALCIUM: 9.6 mg/dL (ref 8.9–10.3)
CO2: 23 mmol/L (ref 22–32)
CREATININE: 0.74 mg/dL (ref 0.44–1.00)
Chloride: 107 mmol/L (ref 101–111)
GFR calc non Af Amer: >60 mL/min (ref >60)
Glucose, Bld: 91 mg/dL (ref 65–99)
Potassium: 3.5 mmol/L (ref 3.5–5.1)
SODIUM: 139 mmol/L (ref 135–145)

## 2017-03-05 LAB — LIPASE, BLOOD: LIPASE: 19 U/L (ref 11–51)

## 2017-03-05 MED ORDER — SODIUM CHLORIDE 0.9 % IV BOLUS (SEPSIS)
1000.0000 mL | Freq: Once | INTRAVENOUS | Status: AC
  Administered 2017-03-05: 1000 mL via INTRAVENOUS

## 2017-03-05 MED ORDER — IOPAMIDOL (ISOVUE-370) INJECTION 76%
INTRAVENOUS | Status: AC
  Administered 2017-03-05: 100 mL
  Filled 2017-03-05: qty 100

## 2017-03-05 NOTE — ED Notes (Signed)
Pt returned from CT °

## 2017-03-05 NOTE — ED Triage Notes (Signed)
Patient reports intermittent pain across her chest onset last week , denies SOB , no nausea or diaphoresis .

## 2017-03-05 NOTE — ED Provider Notes (Signed)
[facility]-EMERGENCY DEPT Provider Note   CSN: [id number] Arrival date & time: 03/05/17  2022     History   Chief Complaint Chief Complaint  Patient presents with  . Chest Pain    HPI Meredith Yoder is a 31 y.o. female with a hx of HTN (treated with metoprolol) presents to the Emergency Department complaining of gradual, persistent, progressively worsening chest pain onset 1 week ago described as a pressure.  Worse with palpation and improved with bra wearing.  She denies recent periods of immobilization, estrogen usage, hx of DVT, hx of cancer or lupus.  She does reports recent intentional 70lb weight loss since NovPt's parents in room are concerned about pt's anxiety surrounding her heath.  Pt denies fever, chills, headache, neck pain, abd pain, SOB, N/V/D.  Pt reports compliance with metoprolol  QD.     The history is provided by the patient and medical records. No language interpreter was used.    Past Medical History:  Diagnosis Date  . Hypertension   . Murmur     Patient Active Problem List   Diagnosis Date Noted  . Chest pain 12/21/2016  . HTN (hypertension) 12/21/2016  . Tachycardia 12/21/2016  . Abnormal thyroid exam 12/21/2016    Past Surgical History:  Procedure Laterality Date  . DENTAL SURGERY      OB History    No data available       Home Medications    Prior to Admission medications   Medication Sig Start Date End Date Taking? Authorizing Provider  metoprolol succinate (TOPROL-XL) 50 MG 24 hr tablet Take 50 mg by mouth daily. Take with or immediately following a meal.   Yes Historical Provider, MD  metoprolol tartrate (LOPRESSOR) 25 MG tablet Take 0.5 tablets (12.5 mg total) by mouth 2 (two) times daily. Patient not taking: Reported on 03/05/2017 12/22/16   Alison Murray, MD  naproxen (NAPROSYN) 500 MG tablet Take 1 tablet (500 mg total) by mouth 2 (two) times daily with a meal. 03/06/17   Dierdre Forth, PA-C    Family History Family  History  Problem Relation Age of Onset  . Hypertension Father     Social History Social History  Substance Use Topics  . Smoking status: Never Smoker  . Smokeless tobacco: Never Used  . Alcohol use No     Comment: occasional     Allergies   Patient has no known allergies.   Review of Systems Review of Systems  Cardiovascular: Positive for chest pain and palpitations.  Psychiatric/Behavioral: The patient is nervous/anxious.   All other systems reviewed and are negative.    Physical Exam Updated Vital Signs BP (!) 150/90   Pulse (!) 102   Temp 97.4 F (36.3 C) (Oral)   Resp 16   LMP 02/12/2017   SpO2 100%   Physical Exam  Constitutional: She appears well-developed and well-nourished. No distress.  Awake, alert, nontoxic appearance  HENT:  Head: Normocephalic and atraumatic.  Mouth/Throat: Oropharynx is clear and moist. No oropharyngeal exudate.  Eyes: Conjunctivae are normal. No scleral icterus.  Neck: Normal range of motion. Neck supple.  Cardiovascular: Regular rhythm and intact distal pulses.  Tachycardia present.   Pulses:      Radial pulses are 2+ on the right side, and 2+ on the left side.       Dorsalis pedis pulses are 2+ on the right side, and 2+ on the left side.  Pulmonary/Chest: Effort normal and breath sounds normal. No respiratory distress. She  has no wheezes.  Equal chest expansion  Abdominal: Soft. Bowel sounds are normal. She exhibits no mass. There is no tenderness. There is no rebound and no guarding.  Musculoskeletal: Normal range of motion. She exhibits no edema.  Neurological: She is alert.  Speech is clear and goal oriented Moves extremities without ataxia  Skin: Skin is warm and dry. She is not diaphoretic.  Psychiatric: She has a normal mood and affect.  Nursing note and vitals reviewed.    ED Treatments / Results  Labs (all labs ordered are listed, but only abnormal results are displayed) Labs Reviewed  HEPATIC FUNCTION PANEL -  Abnormal; Notable for the following:       Result Value   ALT 10 (*)    Bilirubin, Direct <0.1 (*)    All other components within normal limits  BASIC METABOLIC PANEL  CBC  LIPASE, BLOOD  RAPID URINE DRUG SCREEN, HOSP PERFORMED  I-STAT TROPOININ, ED  POC URINE PREG, ED    EKG  EKG Interpretation  Date/Time:  Thursday March 05 2017 20:30:09 EDT Ventricular Rate:  122 PR Interval:  132 QRS Duration: 70 QT Interval:  340 QTC Calculation: 484 R Axis:   87 Text Interpretation:  Sinus tachycardia Otherwise normal ECG Confirmed by Lincoln Brigham [phone]) on 03/05/2017 10:27:19 PM       Radiology Dg Chest 2 View  Result Date: 03/05/2017 CLINICAL DATA:  Chest discomfort and pressure EXAM: CHEST  2 VIEW COMPARISON:  12/21/2016 and chest CT 10/27/2016 FINDINGS: The heart size and mediastinal contours are within normal limits. Both lungs are clear. The visualized skeletal structures are unremarkable. IMPRESSION: No active cardiopulmonary disease. Electronically Signed   By: Tollie Eth M.D.   On: 03/05/2017 21:04   Ct Angio Chest Pe W Or Wo Contrast  Result Date: 03/05/2017 CLINICAL DATA:  Left-sided chest pain and tachycardia for a week. EXAM: CT ANGIOGRAPHY CHEST WITH CONTRAST TECHNIQUE: Multidetector CT imaging of the chest was performed using the standard protocol during bolus administration of intravenous contrast. Multiplanar CT image reconstructions and MIPs were obtained to evaluate the vascular anatomy. CONTRAST:  80 mL Isovue 370 intravenous COMPARISON:  Radiographs 03/05/2017 FINDINGS: Cardiovascular: Satisfactory opacification of the pulmonary arteries to the segmental level. No evidence of pulmonary embolism. Normal heart size. No pericardial effusion. Mediastinum/Nodes: No enlarged mediastinal, hilar, or axillary lymph nodes. Thyroid gland, trachea, and esophagus demonstrate no significant findings. Lungs/Pleura: Lungs are clear. No pleural effusion or pneumothorax. Upper Abdomen: No acute  abnormality. Musculoskeletal: No chest wall abnormality. No acute or significant osseous findings. Review of the MIP images confirms the above findings. IMPRESSION: Negative for acute pulmonary embolism.  No significant abnormality. Electronically Signed   By: Ellery Plunk M.D.   On: 03/05/2017 23:53    Procedures Procedures (including critical care time)  Medications Ordered in ED Medications  sodium chloride 0.9 % bolus 1,000 mL (1,000 mLs Intravenous New Bag/Given 03/05/17 2306)  iopamidol (ISOVUE-370) 76 % injection (100 mLs  Contrast Given 03/05/17 2323)     Initial Impression / Assessment and Plan / ED Course  I have reviewed the triage vital signs and the nursing notes.  Pertinent labs & imaging results that were available during my care of the patient were reviewed by me and considered in my medical decision making (see chart for details).     Patient is to be discharged with recommendation to follow up with PCP And cardiologist in regards to today's hospital visit.  She has appointments on Monday  and Tuesday of next week for these providers. Chest pain is not likely of cardiac or pulmonary etiology d/t presentation, negative CT angiogram chest, VSS, no tracheal deviation, no JVD or new murmur, RRR, breath sounds equal bilaterally, EKG with tachycardia but no evidence of ischemia, negative troponin, and negative CXR. Pt has been advised to return to the ED if CP becomes exertional, associated with diaphoresis or nausea, radiates to left jaw/arm, worsens or becomes concerning in any way. Pt appears reliable for follow up and is agreeable to discharge.   Case has been discussed with and EKG reviewed by Dr. Madilyn Hook who agrees with the plan.   Final Clinical Impressions(s) / ED Diagnoses   Final diagnoses:  Atypical chest pain  Anxiety  Tachycardia    New Prescriptions New Prescriptions   NAPROXEN (NAPROSYN) 500 MG TABLET    Take 1 tablet (500 mg total) by mouth 2 (two) times  daily with a meal.     Dierdre Forth, PA-C 03/06/17 0051    Tilden Fossa, MD 03/12/17 [phone]

## 2017-03-05 NOTE — ED Notes (Signed)
Patient transported to CT 

## 2017-03-06 MED ORDER — NAPROXEN 500 MG PO TABS: 500.0000 mg | ORAL_TABLET | Freq: Two times a day (BID) | ORAL | 0 refills | Status: DC

## 2017-03-06 NOTE — ED Notes (Signed)
Patient able to ambulate independently  

## 2017-03-06 NOTE — Discharge Instructions (Signed)
1. Medications: naprosyn, usual home medications 2. Treatment: rest, drink plenty of fluids,  3. Follow Up: Please followup with your primary doctor and cardiologist at your already scheduled appointments for discussion of your diagnoses and further evaluation after today's visit; if you do not have a primary care doctor use the resource guide provided to find one; Please return to the ER for his pain that is associated with passing out, palpitations, difficulty breathing or other concerns

## 2017-06-02 ENCOUNTER — Ambulatory Visit (HOSPITAL_COMMUNITY)
Admission: EM | Admit: 2017-06-02 | Discharge: 2017-06-02 | Disposition: A | Discharge status: Home / Self Care | Payer: Self-pay | Attending: Internal Medicine | Admitting: Internal Medicine

## 2017-06-02 ENCOUNTER — Encounter (HOSPITAL_COMMUNITY): Payer: Self-pay | Admitting: *Deleted

## 2017-06-02 DIAGNOSIS — G4489 Other headache syndrome: Secondary | ICD-10-CM

## 2017-06-02 MED ORDER — BUTALBITAL-APAP-CAFFEINE 50-325-40 MG PO TABS: 1.0000 | ORAL_TABLET | Freq: Four times a day (QID) | ORAL | 0 refills | Status: DC | PRN

## 2017-06-02 NOTE — ED Provider Notes (Signed)
CSN: [id number]     Arrival date & time 06/02/17  1236 History   None    Chief Complaint  Patient presents with  . Headache   (Consider location/radiation/quality/duration/timing/severity/associated sxs/prior Treatment) Patient c/o headache for 5 days.   The history is provided by the patient.  Headache  Pain location:  Generalized Radiates to:  Does not radiate Severity currently:  5/10 Severity at highest:  5/10 Onset quality:  Sudden Duration:  5 days Timing:  Constant Progression:  Waxing and waning Chronicity:  New Similar to prior headaches: no   Relieved by:  Nothing   Past Medical History:  Diagnosis Date  . Hypertension   . Murmur    Past Surgical History:  Procedure Laterality Date  . DENTAL SURGERY     Family History  Problem Relation Age of Onset  . Hypertension Father    Social History  Substance Use Topics  . Smoking status: Never Smoker  . Smokeless tobacco: Never Used  . Alcohol use No     Comment: occasional   OB History    No data available     Review of Systems  Constitutional: Negative.   HENT: Negative.   Eyes: Negative.   Respiratory: Negative.   Cardiovascular: Negative.   Gastrointestinal: Negative.   Endocrine: Negative.   Genitourinary: Negative.   Musculoskeletal: Negative.   Allergic/Immunologic: Negative.   Neurological: Positive for headaches.  Hematological: Negative.   Psychiatric/Behavioral: Negative.     Allergies  Patient has no known allergies.  Home Medications   Prior to Admission medications   Medication Sig Start Date End Date Taking? Authorizing Provider  butalbital-acetaminophen-caffeine (FIORICET, ESGIC) 50-325-40 MG tablet Take 1-2 tablets by mouth every 6 (six) hours as needed for headache. 06/02/17 06/02/18  Deatra Canter, FNP  metoprolol succinate (TOPROL-XL) 50 MG 24 hr tablet Take 50 mg by mouth daily. Take with or immediately following a meal.    [provider]  metoprolol tartrate  (LOPRESSOR) 25 MG tablet Take 0.5 tablets (12.5 mg total) by mouth 2 (two) times daily. Patient not taking: Reported on 03/05/2017 12/22/16   Alison Murray, MD  naproxen (NAPROSYN) 500 MG tablet Take 1 tablet (500 mg total) by mouth 2 (two) times daily with a meal. 03/06/17   Muthersbaugh, Dahlia Client, PA-C   Meds Ordered and Administered this Visit  Medications - No data to display  BP (!) 156/76 (BP Location: Right Arm)   Pulse (!) 115 Comment: notified rn  Temp 98 F (36.7 C) (Oral)   Resp 16   LMP 06/02/2017   SpO2 100%  No data found.   Physical Exam  Constitutional: She is oriented to person, place, and time. She appears well-developed and well-nourished.  HENT:  Head: Normocephalic and atraumatic.  Right Ear: External ear normal.  Left Ear: External ear normal.  Mouth/Throat: Oropharynx is clear and moist.  Eyes: Conjunctivae and EOM are normal. Pupils are equal, round, and reactive to light.  Neck: Normal range of motion. Neck supple.  Cardiovascular: Normal rate, regular rhythm and normal heart sounds.   Pulmonary/Chest: Effort normal and breath sounds normal.  Abdominal: Soft. Bowel sounds are normal.  Neurological: She is alert and oriented to person, place, and time.  Nursing note and vitals reviewed.   Urgent Care Course     Procedures (including critical care time)  Labs Review Labs Reviewed - No data to display  Imaging Review No results found.   Visual Acuity Review  Right Eye Distance:  Left Eye Distance:   Bilateral Distance:    Right Eye Near:   Left Eye Near:    Bilateral Near:         MDM   1. Other headache syndrome    fioricet take 1-2 po q 6 hours prn #20  Push po fluids, rest, tylenol and motrin otc prn as directed for fever, arthralgias, and myalgias.  Follow up prn if sx's continue or persist.    Deatra CanterOxford, Jaivian Battaglini J, FNP 06/02/17 1359

## 2017-06-02 NOTE — Discharge Instructions (Signed)
Push po fluids, rest, tylenol and motrin otc prn as directed for fever, arthralgias, and myalgias.  Follow up prn if sx's continue or persist.  °

## 2017-06-02 NOTE — ED Triage Notes (Signed)
Headache   X  5  Days     Mainly  In    Temples           No  Nausea  Or  Photophobia

## 2017-07-21 ENCOUNTER — Encounter (HOSPITAL_COMMUNITY): Payer: Self-pay | Admitting: Emergency Medicine

## 2017-07-21 ENCOUNTER — Ambulatory Visit (HOSPITAL_COMMUNITY)
Admission: EM | Admit: 2017-07-21 | Discharge: 2017-07-21 | Disposition: A | Discharge status: Home / Self Care | Payer: Self-pay | Attending: Family Medicine | Admitting: Family Medicine

## 2017-07-21 DIAGNOSIS — J0100 Acute maxillary sinusitis, unspecified: Secondary | ICD-10-CM

## 2017-07-21 MED ORDER — AMOXICILLIN 500 MG PO CAPS: 1000.0000 mg | ORAL_CAPSULE | Freq: Two times a day (BID) | ORAL | 0 refills | Status: DC

## 2017-07-21 NOTE — ED Triage Notes (Signed)
PT reports pain behind left eye, facial pain that worse when bending over, and lightheadedness. PT has taken metoprolol for one year with no problems. No recent dose changes.

## 2017-07-21 NOTE — ED Provider Notes (Signed)
  [facility]   [id number] 07/21/17 Arrival Time: 1344  ASSESSMENT & PLAN:  1. Acute non-recurrent maxillary sinusitis     Meds ordered this encounter  Medications  . amoxicillin (AMOXIL) 500 MG capsule    Sig: Take 2 capsules (1,000 mg total) by mouth 2 (two) times daily.    Dispense:  40 capsule    Refill:  0   OTC as needed. Will f/u if not showing improvement in the next several days.  Reviewed expectations re: course of current medical issues. Questions answered. Outlined signs and symptoms indicating need for more acute intervention. Patient verbalized understanding. After Visit Summary given.   SUBJECTIVE:  Meredith Yoder is a 31 y.o. female who presents with complaint of seasonal allergy exacerbation for the past 1-2 weeks. Past several days with sinus pressure. Worse when bending forward. Feels in upper teeth. Afebrile. No resp symptoms. No OTC treatment. Non-smoker. No h/o recurrent sinusitis.  ROS: As per HPI.   OBJECTIVE:  Vitals:   07/21/17 1438 07/21/17 1440  BP: (!) 146/82   Pulse: (!) 114   Resp: 16   Temp: 99 F (37.2 C)   SpO2: 100%   Weight:  194 lb 0.1 oz (88 kg)  Height:  5\' 6"  (1.676 m)     General appearance: alert; no distress HEENT: nasal congestion; clear runny nose; conjunctivae normal; TMs normal; bilateral maxillary sinus tenderness to palpation Neck: supple Lungs: clear to auscultation bilaterally Skin: warm and dry Psychological:  alert and cooperative; normal mood and affect   Past Medical History:  Diagnosis Date  . Hypertension   . Murmur     No Known Allergies  PMHx, SurgHx, SocialHx, Medications, and Allergies were reviewed in the Visit Navigator and updated as appropriate.       Mardella Layman, MD 07/21/17 [phone]

## 2017-08-24 ENCOUNTER — Other Ambulatory Visit: Payer: Self-pay | Admitting: Family Medicine

## 2017-08-24 ENCOUNTER — Other Ambulatory Visit (HOSPITAL_COMMUNITY)
Admission: RE | Admit: 2017-08-24 | Discharge: 2017-08-24 | Disposition: A | Discharge status: Home / Self Care | Payer: Self-pay | Source: Ambulatory Visit | Attending: Family Medicine | Admitting: Family Medicine

## 2017-08-24 DIAGNOSIS — Z01419 Encounter for gynecological examination (general) (routine) without abnormal findings: Secondary | ICD-10-CM | POA: Insufficient documentation

## 2017-08-27 LAB — CYTOLOGY - PAP
Adequacy: ABSENT
DIAGNOSIS: NEGATIVE

## 2017-10-07 ENCOUNTER — Ambulatory Visit (HOSPITAL_COMMUNITY)
Admission: EM | Admit: 2017-10-07 | Discharge: 2017-10-07 | Disposition: A | Discharge status: Home / Self Care | Payer: Self-pay | Attending: Family Medicine | Admitting: Family Medicine

## 2017-10-07 ENCOUNTER — Encounter (HOSPITAL_COMMUNITY): Payer: Self-pay | Admitting: Family Medicine

## 2017-10-07 DIAGNOSIS — I10 Essential (primary) hypertension: Secondary | ICD-10-CM | POA: Insufficient documentation

## 2017-10-07 DIAGNOSIS — R079 Chest pain, unspecified: Secondary | ICD-10-CM | POA: Insufficient documentation

## 2017-10-07 DIAGNOSIS — Z79899 Other long term (current) drug therapy: Secondary | ICD-10-CM | POA: Insufficient documentation

## 2017-10-07 DIAGNOSIS — Z791 Long term (current) use of non-steroidal anti-inflammatories (NSAID): Secondary | ICD-10-CM | POA: Insufficient documentation

## 2017-10-07 DIAGNOSIS — J029 Acute pharyngitis, unspecified: Secondary | ICD-10-CM | POA: Insufficient documentation

## 2017-10-07 LAB — POCT RAPID STREP A: STREPTOCOCCUS, GROUP A SCREEN (DIRECT): NEGATIVE

## 2017-10-07 MED ORDER — FLUTICASONE PROPIONATE 50 MCG/ACT NA SUSP: 2.0000 | Freq: Every day | NASAL | 0 refills | Status: DC

## 2017-10-07 MED ORDER — CETIRIZINE-PSEUDOEPHEDRINE ER 5-120 MG PO TB12: 1.0000 | ORAL_TABLET | Freq: Every day | ORAL | 0 refills | Status: DC

## 2017-10-07 NOTE — ED Provider Notes (Signed)
[facility]    CSN: [id number] Arrival date & time: 10/07/17  1406     History   Chief Complaint Chief Complaint  Patient presents with  . Sore Throat    HPI Meredith Yoder is a 31 y.o. female.   31 year old female comes in with family member for 2 day history of sore throat. Noticed white patches this morning. Has had some sinus pressure. Denies cough. Rhinorrhea, nasal congestions. Some gagging/dry heaving this morning without vomiting. Denies abdominal pain. Denies chest pain, shortness of breath, wheezing, trouble breathing. No trouble swallowing, swelling of the throat. otc muscinex and cough drops with some improvement. Positive sick contact. Never smoker.       Past Medical History:  Diagnosis Date  . Hypertension   . Murmur     Patient Active Problem List   Diagnosis Date Noted  . Chest pain 12/21/2016  . HTN (hypertension) 12/21/2016  . Tachycardia 12/21/2016  . Abnormal thyroid exam 12/21/2016    Past Surgical History:  Procedure Laterality Date  . DENTAL SURGERY      OB History    No data available       Home Medications    Prior to Admission medications   Medication Sig Start Date End Date Taking? Authorizing Provider  amoxicillin (AMOXIL) 500 MG capsule Take 2 capsules (1,000 mg total) by mouth 2 (two) times daily. 07/21/17   Mardella LaymanHagler, Brian, MD  butalbital-acetaminophen-caffeine (FIORICET, ESGIC) [phone] MG tablet Take 1-2 tablets by mouth every 6 (six) hours as needed for headache. 06/02/17 06/02/18  Deatra Canterxford, William J, FNP  cetirizine-pseudoephedrine (ZYRTEC-D) 5-120 MG tablet Take 1 tablet daily by mouth. 10/07/17   Cathie HoopsYu, Amy V, PA-C  fluticasone (FLONASE) 50 MCG/ACT nasal spray Place 2 sprays daily into both nostrils. 10/07/17   Cathie HoopsYu, Amy V, PA-C  metoprolol succinate (TOPROL-XL) 50 MG 24 hr tablet Take 50 mg by mouth daily. Take with or immediately following a meal.    [provider]  metoprolol succinate (TOPROL-XL) 50 MG  24 hr tablet Take 50 mg by mouth daily. Take with or immediately following a meal.    [provider]  metoprolol tartrate (LOPRESSOR) 25 MG tablet Take 0.5 tablets (12.5 mg total) by mouth 2 (two) times daily. Patient not taking: Reported on 03/05/2017 12/22/16   Alison Murrayevine, Alma M, MD  naproxen (NAPROSYN) 500 MG tablet Take 1 tablet (500 mg total) by mouth 2 (two) times daily with a meal. 03/06/17   Muthersbaugh, Dahlia ClientHannah, PA-C    Family History Family History  Problem Relation Age of Onset  . Hypertension Father     Social History Social History   Tobacco Use  . Smoking status: Never Smoker  . Smokeless tobacco: Never Used  Substance Use Topics  . Alcohol use: No    Comment: occasional  . Drug use: No     Allergies   Patient has no known allergies.   Review of Systems Review of Systems  Reason unable to perform ROS: See HPI as above.     Physical Exam Triage Vital Signs ED Triage Vitals  Enc Vitals Group     BP 10/07/17 1426 (!) 152/89     Pulse Rate 10/07/17 1426 76     Resp 10/07/17 1426 18     Temp 10/07/17 1426 98.7 F (37.1 C)     Temp Source 10/07/17 1426 Oral     SpO2 10/07/17 1426 100 %     Weight --  Height --      Head Circumference --      Peak Flow --      Pain Score 10/07/17 1423 5     Pain Loc --      Pain Edu? --      Excl. in GC? --    No data found.  Updated Vital Signs BP (!) 152/89   Pulse 76   Temp 98.7 F (37.1 C) (Oral)   Resp 18   SpO2 100%   Physical Exam  Constitutional: She is oriented to person, place, and time. She appears well-developed and well-nourished. No distress.  HENT:  Head: Normocephalic and atraumatic.  Right Ear: Tympanic membrane, external ear and ear canal normal. Tympanic membrane is not erythematous and not bulging.  Left Ear: Tympanic membrane, external ear and ear canal normal. Tympanic membrane is not erythematous and not bulging.  Nose: Mucosal edema and rhinorrhea present. Right sinus exhibits  maxillary sinus tenderness. Right sinus exhibits no frontal sinus tenderness. Left sinus exhibits maxillary sinus tenderness. Left sinus exhibits no frontal sinus tenderness.  Mouth/Throat: Uvula is midline, oropharynx is clear and moist and mucous membranes are normal. Tonsils are 2+ on the right. Tonsils are 2+ on the left. Tonsillar exudate (left).  Eyes: Conjunctivae are normal. Pupils are equal, round, and reactive to light.  Neck: Normal range of motion. Neck supple.  Cardiovascular: Normal rate, regular rhythm and normal heart sounds. Exam reveals no gallop and no friction rub.  No murmur heard. Pulmonary/Chest: Effort normal and breath sounds normal. She has no decreased breath sounds. She has no wheezes. She has no rhonchi. She has no rales.  Lymphadenopathy:    She has no cervical adenopathy.  Neurological: She is alert and oriented to person, place, and time.  Skin: Skin is warm and dry.  Psychiatric: She has a normal mood and affect. Her behavior is normal. Judgment normal.     UC Treatments / Results  Labs (all labs ordered are listed, but only abnormal results are displayed) Labs Reviewed  CULTURE, GROUP A STREP [facility])  POCT RAPID STREP A    EKG  EKG Interpretation None       Radiology No results found.  Procedures Procedures (including critical care time)  Medications Ordered in UC Medications - No data to display   Initial Impression / Assessment and Plan / UC Course  I have reviewed the triage vital signs and the nursing notes.  Pertinent labs & imaging results that were available during my care of the patient were reviewed by me and considered in my medical decision making (see chart for details).    Rapid strep negative. Symptomatic treatment as needed. Return precautions given.   Final Clinical Impressions(s) / UC Diagnoses   Final diagnoses:  Pharyngitis, unspecified etiology    ED Discharge Orders        Ordered    fluticasone (FLONASE) 50  MCG/ACT nasal spray  Daily     10/07/17 1444    cetirizine-pseudoephedrine (ZYRTEC-D) 5-120 MG tablet  Daily     10/07/17 1444        Belinda FisherYu, Amy V, [place] JerseyPA-C 10/07/17 1503

## 2017-10-07 NOTE — Discharge Instructions (Signed)
Rapid strep negative. Symptoms are most likely due to viral illness. Flonase and/or Zyrtec-D for nasal congestion. You can use over the counter nasal saline rinse such as neti pot for nasal congestion. Monitor for any worsening of symptoms, swelling of the throat, trouble breathing, trouble swallowing, follow up for reevaluation.   For sore throat try using a honey-based tea. Use 3 teaspoons of honey with juice squeezed from half lemon. Place shaved pieces of ginger into 1/2-1 cup of water and warm over stove top. Then mix the ingredients and repeat every 4 hours as needed.

## 2017-10-07 NOTE — ED Triage Notes (Signed)
Pt here for sore throat with white patches.

## 2017-10-10 LAB — CULTURE, GROUP A STREP (THRC)

## 2017-11-07 ENCOUNTER — Encounter (HOSPITAL_COMMUNITY): Payer: Self-pay | Admitting: Emergency Medicine

## 2017-11-07 ENCOUNTER — Emergency Department (HOSPITAL_COMMUNITY)
Admission: EM | Admit: 2017-11-07 | Discharge: 2017-11-07 | Disposition: A | Discharge status: Home / Self Care | Payer: Self-pay | Attending: Emergency Medicine | Admitting: Emergency Medicine

## 2017-11-07 ENCOUNTER — Emergency Department (HOSPITAL_COMMUNITY): Payer: Self-pay

## 2017-11-07 DIAGNOSIS — K219 Gastro-esophageal reflux disease without esophagitis: Secondary | ICD-10-CM | POA: Insufficient documentation

## 2017-11-07 DIAGNOSIS — I10 Essential (primary) hypertension: Secondary | ICD-10-CM | POA: Insufficient documentation

## 2017-11-07 DIAGNOSIS — Z79899 Other long term (current) drug therapy: Secondary | ICD-10-CM | POA: Insufficient documentation

## 2017-11-07 LAB — HEPATIC FUNCTION PANEL
ALK PHOS: 68 U/L (ref 38–126)
ALT: 11 U/L — ABNORMAL LOW (ref 14–54)
AST: 20 U/L (ref 15–41)
Albumin: 4 g/dL (ref 3.5–5.0)
BILIRUBIN TOTAL: 0.6 mg/dL (ref 0.3–1.2)
Total Protein: 7 g/dL (ref 6.5–8.1)

## 2017-11-07 LAB — CBC
HCT: 36.9 % (ref 36.0–46.0)
HEMOGLOBIN: 12.2 g/dL (ref 12.0–15.0)
MCH: 27 pg (ref 26.0–34.0)
MCHC: 33.1 g/dL (ref 30.0–36.0)
MCV: 81.6 fL (ref 78.0–100.0)
Platelets: 204 10*3/uL (ref 150–400)
RBC: 4.52 MIL/uL (ref 3.87–5.11)
RDW: 14.8 % (ref 11.5–15.5)
WBC: 4.8 10*3/uL (ref 4.0–10.5)

## 2017-11-07 LAB — I-STAT BETA HCG BLOOD, ED (MC, WL, AP ONLY): I-stat hCG, quantitative: <5 m[IU]/mL (ref <5)

## 2017-11-07 LAB — BASIC METABOLIC PANEL
ANION GAP: 6 (ref 5–15)
BUN: 7 mg/dL (ref 6–20)
CALCIUM: 9 mg/dL (ref 8.9–10.3)
CO2: 23 mmol/L (ref 22–32)
Chloride: 108 mmol/L (ref 101–111)
Creatinine, Ser: 0.85 mg/dL (ref 0.44–1.00)
GFR calc Af Amer: >60 mL/min (ref >60)
GLUCOSE: 91 mg/dL (ref 65–99)
Potassium: 3.6 mmol/L (ref 3.5–5.1)
SODIUM: 137 mmol/L (ref 135–145)

## 2017-11-07 LAB — LIPASE, BLOOD: LIPASE: 23 U/L (ref 11–51)

## 2017-11-07 LAB — I-STAT TROPONIN, ED: TROPONIN I, POC: 0 ng/mL (ref 0.00–0.08)

## 2017-11-07 MED ORDER — GI COCKTAIL ~~LOC~~
30.0000 mL | Freq: Once | ORAL | Status: AC
  Administered 2017-11-07: 30 mL via ORAL
  Filled 2017-11-07: qty 30

## 2017-11-07 MED ORDER — PANTOPRAZOLE SODIUM 40 MG PO TBEC: 40.0000 mg | DELAYED_RELEASE_TABLET | Freq: Every day | ORAL | 0 refills | Status: DC

## 2017-11-07 NOTE — Discharge Instructions (Signed)
Take Protonix once daily. Follow up with your doctor

## 2017-11-07 NOTE — ED Triage Notes (Signed)
The pt states heartburn since Tuesday, saw PCP and was prescribed omeprazole. Meds not working. Pt unable to tell me if pain comes and goes or is constant. Pt states she feels very anxious

## 2017-11-07 NOTE — ED Provider Notes (Signed)
[facility] Provider Note   CSN: [id number] Arrival date & time: 11/07/17  1312   History   Chief Complaint Chief Complaint  Patient presents with  . Chest Pain    HPI Meredith Yoder is a 31 y.o. female who presents with chest burning. PMH significant for HTN. She states that she has had intermittent chest burning for the past 4 days. It's worse with eating and feels like her chest "is on fire". She also has upper abdominal pain which also feels like burning and some nausea. It is worse in the morning. She states that she has not had significant reflux in the past. She saw her PCP who started her on Omeprazole which she has been taking with no relief. She has also tried Zantac. She denies fever, chills, lightheadedness, SOB, cough, vomiting, diarrhea, urinary symptoms. No prior abdominal surgeries. Pt denies significant alcohol use but does use NSAIDs regularly for MSK pain.  HPI  Past Medical History:  Diagnosis Date  . Hypertension   . Murmur     Patient Active Problem List   Diagnosis Date Noted  . Chest pain 12/21/2016  . HTN (hypertension) 12/21/2016  . Tachycardia 12/21/2016  . Abnormal thyroid exam 12/21/2016    Past Surgical History:  Procedure Laterality Date  . DENTAL SURGERY      OB History    No data available       Home Medications    Prior to Admission medications   Medication Sig Start Date End Date Taking? Authorizing Provider  amoxicillin (AMOXIL) 500 MG capsule Take 2 capsules (1,000 mg total) by mouth 2 (two) times daily. 07/21/17   Mardella LaymanHagler, Brian, MD  butalbital-acetaminophen-caffeine (FIORICET, ESGIC) [phone] MG tablet Take 1-2 tablets by mouth every 6 (six) hours as needed for headache. 06/02/17 06/02/18  Deatra Canterxford, William J, FNP  cetirizine-pseudoephedrine (ZYRTEC-D) 5-120 MG tablet Take 1 tablet daily by mouth. 10/07/17   Cathie HoopsYu, Amy V, PA-C  fluticasone (FLONASE) 50 MCG/ACT nasal spray Place 2 sprays daily  into both nostrils. 10/07/17   Cathie HoopsYu, Amy V, PA-C  metoprolol succinate (TOPROL-XL) 50 MG 24 hr tablet Take 50 mg by mouth daily. Take with or immediately following a meal.    [provider]  metoprolol succinate (TOPROL-XL) 50 MG 24 hr tablet Take 50 mg by mouth daily. Take with or immediately following a meal.    [provider]  metoprolol tartrate (LOPRESSOR) 25 MG tablet Take 0.5 tablets (12.5 mg total) by mouth 2 (two) times daily. Patient not taking: Reported on 03/05/2017 12/22/16   Alison Murrayevine, Alma M, MD  naproxen (NAPROSYN) 500 MG tablet Take 1 tablet (500 mg total) by mouth 2 (two) times daily with a meal. 03/06/17   Muthersbaugh, Dahlia ClientHannah, PA-C    Family History Family History  Problem Relation Age of Onset  . Hypertension Father     Social History Social History   Tobacco Use  . Smoking status: Never Smoker  . Smokeless tobacco: Never Used  Substance Use Topics  . Alcohol use: No    Comment: occasional  . Drug use: No     Allergies   Patient has no known allergies.   Review of Systems Review of Systems  Constitutional: Negative for chills and fever.  Respiratory: Negative for cough and shortness of breath.   Cardiovascular: Positive for chest pain.  Gastrointestinal: Positive for abdominal pain and nausea. Negative for diarrhea and vomiting.  Genitourinary: Negative for dysuria and frequency.  All other  systems reviewed and are negative.    Physical Exam Updated Vital Signs BP (!) 152/91 (BP Location: Right Arm)   Pulse 84   Resp 14   Ht 5\' 4"  (1.626 m)   Wt 88.9 kg (196 lb)   LMP 11/07/2017   SpO2 100%   BMI 33.64 kg/m   Physical Exam  Constitutional: She is oriented to person, place, and time. She appears well-developed and well-nourished. No distress.  HENT:  Head: Normocephalic and atraumatic.  Eyes: Conjunctivae are normal. Pupils are equal, round, and reactive to light. Right eye exhibits no discharge. Left eye exhibits no discharge. No  scleral icterus.  Neck: Normal range of motion.  Cardiovascular: Normal rate and regular rhythm. Exam reveals no gallop and no friction rub.  No murmur heard. Pulmonary/Chest: Effort normal and breath sounds normal. No stridor. No respiratory distress. She has no wheezes. She has no rales. She exhibits no tenderness.  Abdominal: Soft. Bowel sounds are normal. She exhibits no distension and no mass. There is no tenderness. There is no rebound and no guarding.  No tenderness but pt states that she has burning sensation across the upper abdomen in to the chest  Neurological: She is alert and oriented to person, place, and time.  Skin: Skin is warm and dry.  Psychiatric: She has a normal mood and affect. Her behavior is normal.  Nursing note and vitals reviewed.    ED Treatments / Results  Labs (all labs ordered are listed, but only abnormal results are displayed) Labs Reviewed  HEPATIC FUNCTION PANEL - Abnormal; Notable for the following components:      Result Value   ALT 11 (*)    Bilirubin, Direct <0.1 (*)    All other components within normal limits  BASIC METABOLIC PANEL  CBC  LIPASE, BLOOD  I-STAT TROPONIN, ED  I-STAT BETA HCG BLOOD, ED ([facility], [facility], [facility] ONLY)    EKG  EKG Interpretation  Date/Time:  Saturday November 07 2017 13:19:05 EST Ventricular Rate:  83 PR Interval:  138 QRS Duration: 76 QT Interval:  356 QTC Calculation: 418 R Axis:   85 Text Interpretation:  Normal sinus rhythm Normal ECG No significant change since last tracing Confirmed by Drema Pry [phone]) on 11/07/2017 2:34:26 PM       Radiology Dg Chest 2 View  Result Date: 11/07/2017 CLINICAL DATA:  31 year old female with chest pain for 5 days. EXAM: CHEST  2 VIEW COMPARISON:  03/05/2017 FINDINGS: The heart size and mediastinal contours are within normal limits. Both lungs are clear. The visualized skeletal structures are unremarkable. IMPRESSION: No active cardiopulmonary disease. Electronically Signed    By: Sande Brothers M.D.   On: 11/07/2017 13:39    Procedures Procedures (including critical care time)  Medications Ordered in ED Medications  gi cocktail (Maalox,Lidocaine,Donnatal) (30 mLs Oral Given 11/07/17 1546)     Initial Impression / Assessment and Plan / ED Course  I have reviewed the triage vital signs and the nursing notes.  Pertinent labs & imaging results that were available during my care of the patient were reviewed by me and considered in my medical decision making (see chart for details).  31 year old female presents with chest/abdominal pain consistent with reflux/gastritis. She is mildly hypertensive but otherwise vitals are normal. Abdomen is soft, non-tender. EKG is normal. Labs are normal. Trop is 0. CXR is normal. Preg test is negative. Will add LFTs and lipase and give GI cocktail and reassess.  Pt reports significant improvement with  GI cocktail. LFTs and lipase are normal. Will have her try Protonix. She was given prescription and 30 day supply coupon. She was advised to follow up with her doctor and return if worsening.  Final Clinical Impressions(s) / ED Diagnoses   Final diagnoses:  Gastroesophageal reflux disease, esophagitis presence not specified    ED Discharge Orders    None       Bethel BornGekas, Katriel Cutsforth Marie, PA-C 11/08/17 1013    Nira Connardama, Pedro Eduardo, MD 11/08/17 1731

## 2017-12-11 IMAGING — CT CT ANGIO CHEST
2 of 8 series · 18 of 46 positions shown · IV contrast (OMNI)
Comparison: None.

ADDENDUM:
Corrected Impression: Somewhat limited contrast bolus but NO
significant central pulmonary

emboli are demonstrated. No evidence of active pulmonary disease.
CLINICAL DATA: Chest pain and tachycardia.
EXAM:
CT ANGIOGRAPHY CHEST WITH CONTRAST
TECHNIQUE: Multidetector CT imaging of the chest was performed using the
standard protocol during bolus administration of intravenous
contrast. Multiplanar CT image reconstructions and MIPs were
obtained to evaluate the vascular anatomy.
CONTRAST:  100 mL Isovue 370

[Series 5: thins · axial · 0.60mm/px · z∈[+1034,+1248]mm · 15 of 236 slices shown]
[im 11/236  lung]
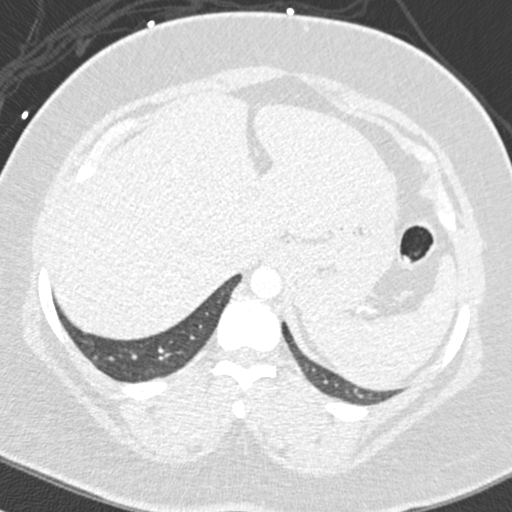
[im 33/236  soft-tissue]
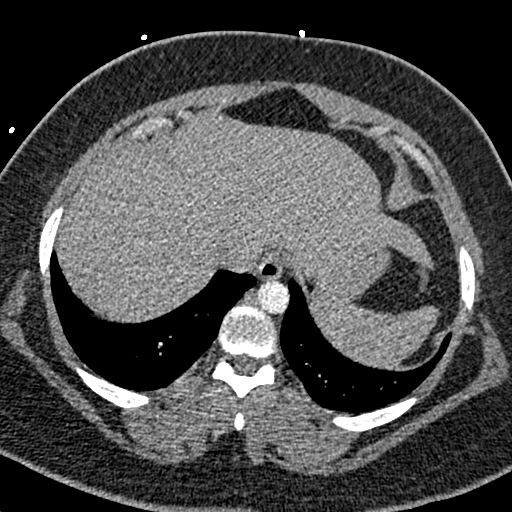
[im 43/236  lung]
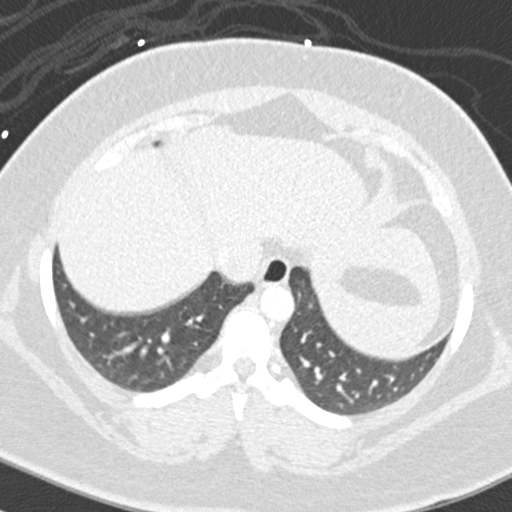
[im 54/236  soft-tissue]
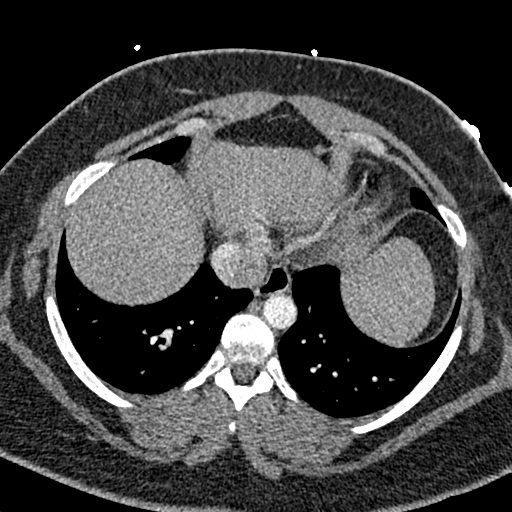
[im 75/236  lung]
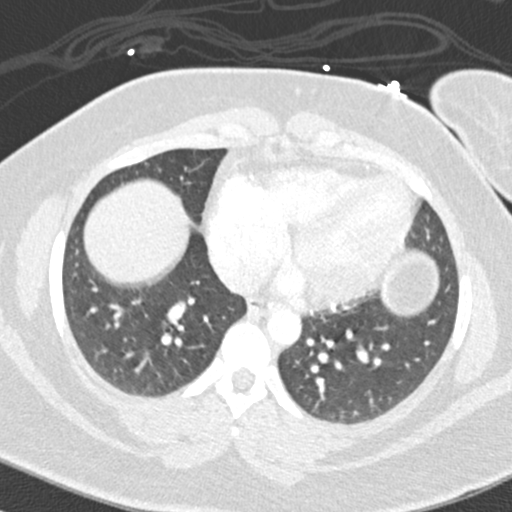
[im 86/236  soft-tissue]
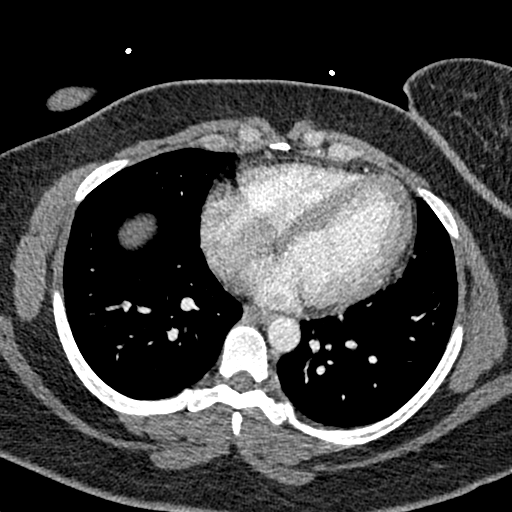
[im 107/236  lung]
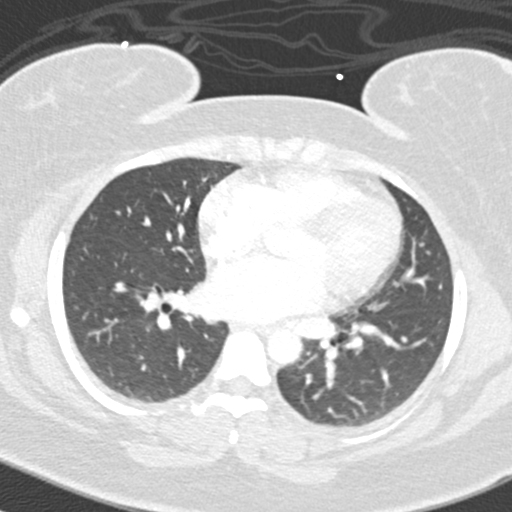
[im 118/236  soft-tissue]
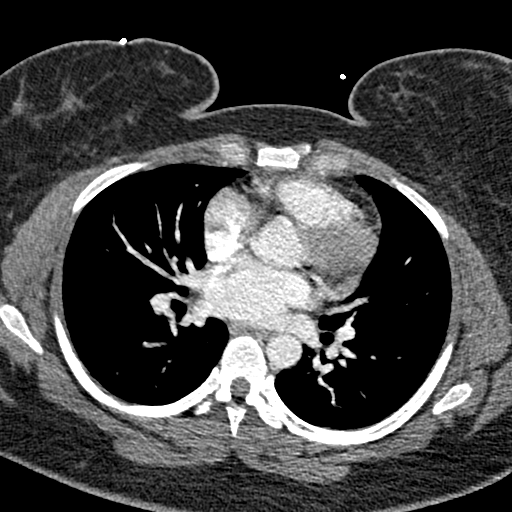
[im 129/236  lung]
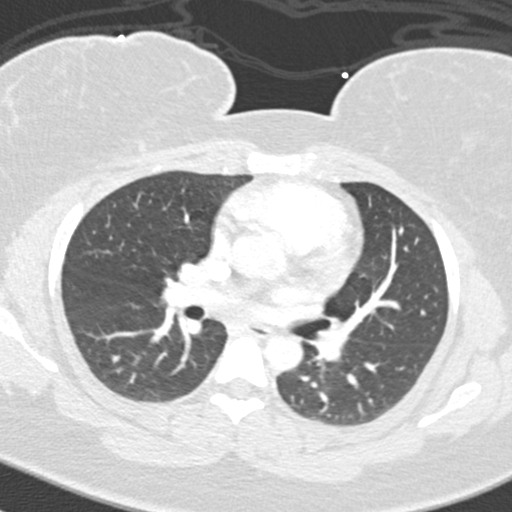
[im 150/236  soft-tissue]
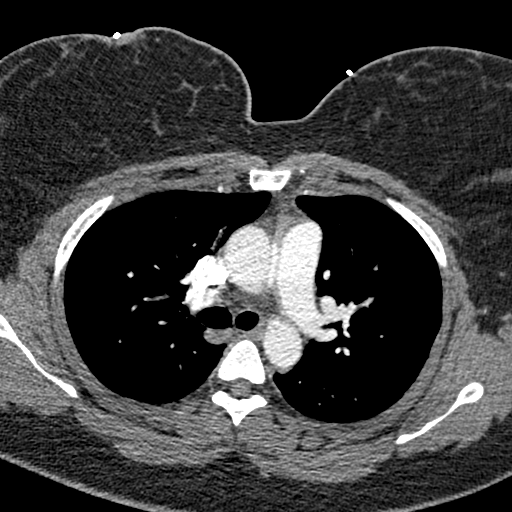
[im 161/236  lung]
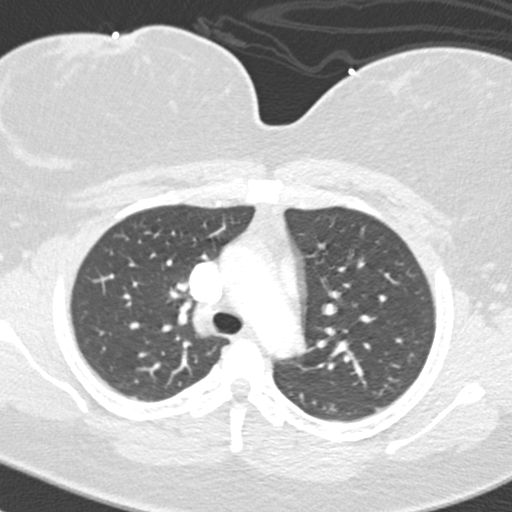
[im 182/236  soft-tissue]
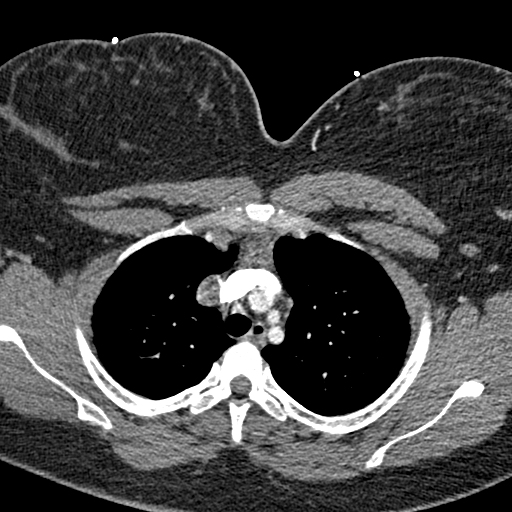
[im 193/236  lung]
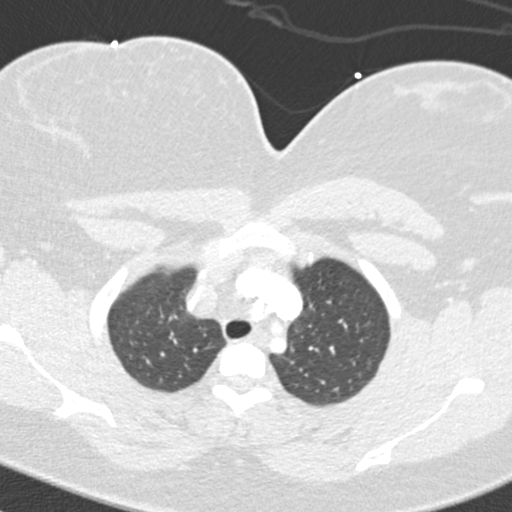
[im 203/236  soft-tissue]
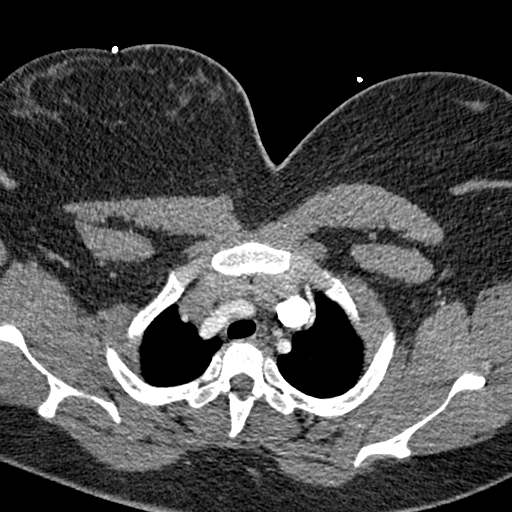
[im 225/236  lung]
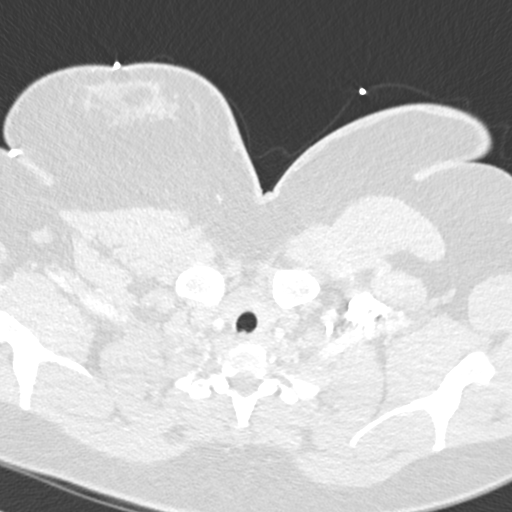

[Series 7: coronal mpr · coronal · 0.48mm/px · 3 of 151 slices shown]
[im 38/151  soft-tissue]
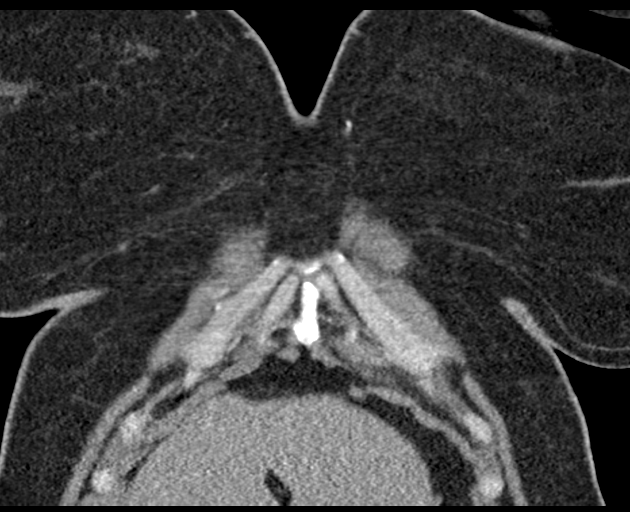
[im 76/151  soft-tissue]
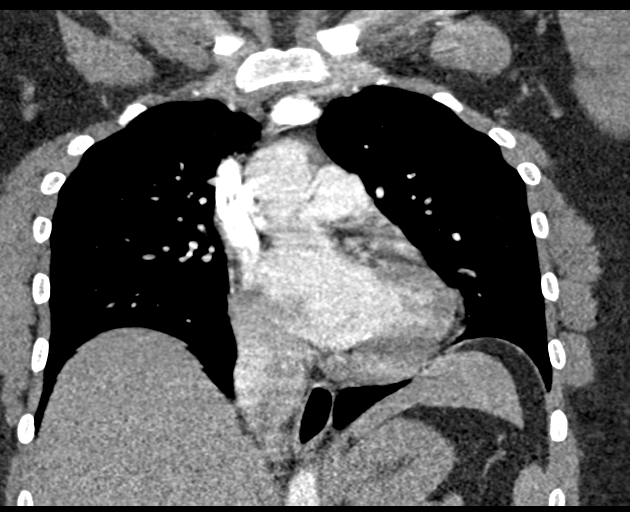
[im 113/151  soft-tissue]
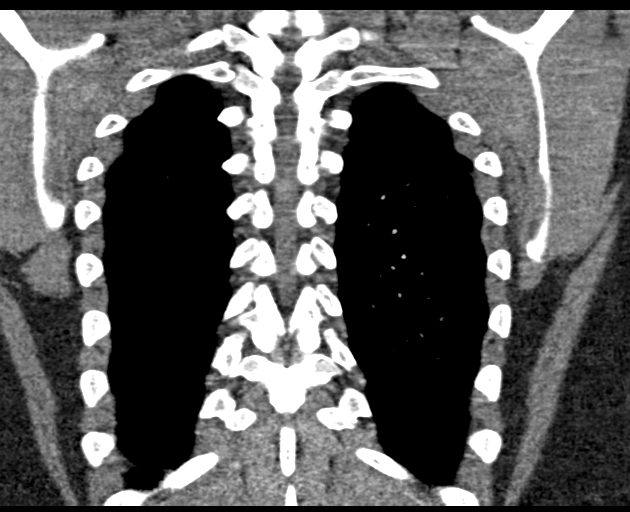

[18 of 46 positions shown; findings below may reference images not displayed]

IMPRESSION: Somewhat limited contrast bolus but NO significant
central pulmonary emboli are demonstrated. No evidence of active
pulmonary disease.
FINDINGS: Cardiovascular: Examination is technically limited due to somewhat
poor contrast bolus. However, the central and proximal segmental
pulmonary arteries are moderately well visualized and no filling
defects are identified. No evidence of significant central pulmonary
embolus. Normal heart size. Normal caliber thoracic aorta. No
evidence of aortic dissection. No pericardial effusion.

Mediastinum/Nodes: No enlarged mediastinal, hilar, or axillary lymph
nodes. Thyroid gland, trachea, and esophagus demonstrate no
significant findings.

Lungs/Pleura: Lungs are clear. No pleural effusion or pneumothorax.

Upper Abdomen: No acute abnormality.

Musculoskeletal: No chest wall abnormality. No acute or significant
osseous findings.

Review of the MIP images confirms the above findings.
IMPRESSION: Somewhat limited contrast bolus but significant central pulmonary
emboli are demonstrated. No evidence of active pulmonary disease.

## 2018-03-17 ENCOUNTER — Ambulatory Visit (HOSPITAL_COMMUNITY)
Admission: EM | Admit: 2018-03-17 | Discharge: 2018-03-17 | Disposition: A | Discharge status: Home / Self Care | Payer: Self-pay | Attending: Family Medicine | Admitting: Family Medicine

## 2018-03-17 ENCOUNTER — Other Ambulatory Visit: Payer: Self-pay

## 2018-03-17 ENCOUNTER — Encounter (HOSPITAL_COMMUNITY): Payer: Self-pay | Admitting: Emergency Medicine

## 2018-03-17 DIAGNOSIS — J0101 Acute recurrent maxillary sinusitis: Secondary | ICD-10-CM

## 2018-03-17 MED ORDER — PREDNISONE 20 MG PO TABS: 20.0000 mg | ORAL_TABLET | Freq: Every day | ORAL | 0 refills | Status: DC

## 2018-03-17 MED ORDER — AMOXICILLIN 875 MG PO TABS: 875.0000 mg | ORAL_TABLET | Freq: Two times a day (BID) | ORAL | 0 refills | Status: DC

## 2018-03-17 NOTE — ED Provider Notes (Signed)
[facility]   [id number] 03/17/18 Arrival Time: 1019   SUBJECTIVE:  Meredith Yoder is a 32 y.o. female who presents to the urgent care with complaint of nose and ears burning, no sore throat, general aches, runny nose, but no cough.  Patient works [employer] so she is outside quite a bit  Her PCP called in PrestonAllegra and Flonase which hasn't helped.  The Flonase causes burning in the nose  No fever  Left ear is uncomfortable.   Past Medical History:  Diagnosis Date  . Hypertension   . Murmur    Family History  Problem Relation Age of Onset  . Hypertension Father    Social History   Socioeconomic History  . Marital status: Single    Spouse name: Not on file  . Number of children: Not on file  . Years of education: Not on file  . Highest education level: Not on file  Occupational History  . Not on file  Social Needs  . Financial resource strain: Not on file  . Food insecurity:    Worry: Not on file    Inability: Not on file  . Transportation needs:    Medical: Not on file    Non-medical: Not on file  Tobacco Use  . Smoking status: Never Smoker  . Smokeless tobacco: Never Used  Substance and Sexual Activity  . Alcohol use: No    Comment: occasional  . Drug use: No  . Sexual activity: Yes    Birth control/protection: None  Lifestyle  . Physical activity:    Days per week: Not on file    Minutes per session: Not on file  . Stress: Not on file  Relationships  . Social connections:    Talks on phone: Not on file    Gets together: Not on file    Attends religious service: Not on file    Active member of club or organization: Not on file    Attends meetings of clubs or organizations: Not on file    Relationship status: Not on file  . Intimate partner violence:    Fear of current or ex partner: Not on file    Emotionally abused: Not on file    Physically abused: Not on file    Forced sexual activity: Not  on file  Other Topics Concern  . Not on file  Social History Narrative  . Not on file   Current Meds  Medication Sig  . fexofenadine (ALLEGRA) 60 MG tablet Take 60 mg by mouth 2 (two) times daily.  . fluticasone (FLONASE) 50 MCG/ACT nasal spray Place 2 sprays daily into both nostrils.  . metoprolol succinate (TOPROL-XL) 50 MG 24 hr tablet Take 50 mg by mouth daily. Take with or immediately following a meal.  . omeprazole (PRILOSEC) 20 MG capsule Take 20 mg by mouth daily.   No Known Allergies    ROS: As per HPI, remainder of ROS negative.   OBJECTIVE:   Vitals:   03/17/18 1052  BP: 138/89  Pulse: (!) 116  Resp: 18  Temp: 98 F (36.7 C)  TempSrc: Oral  SpO2: 100%     General appearance: alert; no distress Eyes: PERRL; EOMI; conjunctiva normal HENT: normocephalic; atraumatic; TMs show SOM which is worse on left, canal normal, external ears normal without trauma; nasal mucosa normal; oral mucosa normal Neck: supple Lungs: clear to auscultation bilaterally Heart: regular rate and rhythm Back: no CVA tenderness Extremities: no  cyanosis or edema; symmetrical with no gross deformities Skin: warm and dry Neurologic: normal gait; grossly normal Psychological: alert and cooperative; normal mood and affect      Labs:  Results for orders placed or performed during the hospital encounter of 11/07/17  Basic metabolic panel  Result Value Ref Range   Sodium 137 135 - 145 mmol/L   Potassium 3.6 3.5 - 5.1 mmol/L   Chloride 108 101 - 111 mmol/L   CO2 23 22 - 32 mmol/L   Glucose, Bld 91 65 - 99 mg/dL   BUN 7 6 - 20 mg/dL   Creatinine, Ser 1.61 0.44 - 1.00 mg/dL   Calcium 9.0 8.9 - 09.6 mg/dL   GFR calc non Af Amer >60 >60 mL/min   GFR calc Af Amer >60 >60 mL/min   Anion gap 6 5 - 15  CBC  Result Value Ref Range   WBC 4.8 4.0 - 10.5 K/uL   RBC 4.52 3.87 - 5.11 MIL/uL   Hemoglobin 12.2 12.0 - 15.0 g/dL   HCT 04.5 40.9 - 81.1 %   MCV 81.6 78.0 - 100.0 fL   MCH 27.0  26.0 - 34.0 pg   MCHC 33.1 30.0 - 36.0 g/dL   RDW 91.4 78.2 - 95.6 %   Platelets 204 150 - 400 K/uL  Hepatic function panel  Result Value Ref Range   Total Protein 7.0 6.5 - 8.1 g/dL   Albumin 4.0 3.5 - 5.0 g/dL   AST 20 15 - 41 U/L   ALT 11 (L) 14 - 54 U/L   Alkaline Phosphatase 68 38 - 126 U/L   Total Bilirubin 0.6 0.3 - 1.2 mg/dL   Bilirubin, Direct <2.1 (L) 0.1 - 0.5 mg/dL   Indirect Bilirubin NOT CALCULATED 0.3 - 0.9 mg/dL  Lipase, blood  Result Value Ref Range   Lipase 23 11 - 51 U/L  I-stat troponin, ED  Result Value Ref Range   Troponin i, poc 0.00 0.00 - 0.08 ng/mL   Comment 3          I-Stat beta hCG blood, ED  Result Value Ref Range   I-stat hCG, quantitative <5.0 <5 mIU/mL   Comment 3            Labs Reviewed - No data to display  No results found.     ASSESSMENT & PLAN:  1. Acute recurrent maxillary sinusitis     Meds ordered this encounter  Medications  . amoxicillin (AMOXIL) 875 MG tablet    Sig: Take 1 tablet (875 mg total) by mouth 2 (two) times daily.    Dispense:  20 tablet    Refill:  0  . predniSONE (DELTASONE) 20 MG tablet    Sig: Take 1 tablet (20 mg total) by mouth daily with lunch. Two daily with food    Dispense:  5 tablet    Refill:  0    Reviewed expectations re: course of current medical issues. Questions answered. Outlined signs and symptoms indicating need for more acute intervention. Patient verbalized understanding. After Visit Summary given.    Procedures:      Elvina Sidle, MD 03/17/18 1109

## 2018-03-17 NOTE — ED Triage Notes (Signed)
Last week patient started having symptoms:nose and ears burning, no sore throat, general aches, runny nose, but no cough

## 2018-03-18 ENCOUNTER — Telehealth (HOSPITAL_COMMUNITY): Payer: Self-pay

## 2018-04-02 ENCOUNTER — Other Ambulatory Visit: Payer: Self-pay

## 2018-04-02 ENCOUNTER — Encounter (HOSPITAL_COMMUNITY): Payer: Self-pay

## 2018-04-02 ENCOUNTER — Ambulatory Visit (HOSPITAL_COMMUNITY)
Admission: EM | Admit: 2018-04-02 | Discharge: 2018-04-02 | Disposition: A | Discharge status: Home / Self Care | Payer: Self-pay | Attending: Family Medicine | Admitting: Family Medicine

## 2018-04-02 DIAGNOSIS — J0100 Acute maxillary sinusitis, unspecified: Secondary | ICD-10-CM

## 2018-04-02 MED ORDER — AMOXICILLIN-POT CLAVULANATE 875-125 MG PO TABS: 1.0000 | ORAL_TABLET | Freq: Two times a day (BID) | ORAL | 0 refills | Status: AC

## 2018-04-02 NOTE — ED Provider Notes (Signed)
[facility]    CSN: [id number] Arrival date & time: 04/02/18  1004     History   Chief Complaint No chief complaint on file.   HPI Meredith Yoder is a 32 y.o. female.   Complains of worsening sinus pain and pressure that began 2 weeks ago.  She localizes her pain to the maxillary sinuses.  She describes the pain as intermittent and aching and throbbing.  She has tried Careers adviser and flonase prescribed by PCP without improvement.  She was seen 2 weeks ago by Dr. Milus Glazier for and diagnosed with sinus infection.  She was treated with amoxicillin 875 twice daily for 10 days and 5 day course of prednisone.  She reports temporary relief in her symptoms with treatment.  Her symptoms are made worse lying down or leaning forward.  She denies have similar symptoms in the past.   Patient also complains of "vertigo."  States her PCP told her this may be a side of effect of metoprolol which she takes for blood pressure.       Past Medical History:  Diagnosis Date  . Hypertension   . Murmur     Patient Active Problem List   Diagnosis Date Noted  . Chest pain 12/21/2016  . HTN (hypertension) 12/21/2016  . Tachycardia 12/21/2016  . Abnormal thyroid exam 12/21/2016    Past Surgical History:  Procedure Laterality Date  . DENTAL SURGERY      OB History   None      Home Medications    Prior to Admission medications   Medication Sig Start Date End Date Taking? Authorizing Provider  fexofenadine (ALLEGRA) 60 MG tablet Take 60 mg by mouth 2 (two) times daily.   Yes [provider]  metoprolol succinate (TOPROL-XL) 50 MG 24 hr tablet Take 50 mg by mouth daily. Take with or immediately following a meal.   Yes [provider]  pantoprazole (PROTONIX) 40 MG tablet Take 1 tablet (40 mg total) by mouth daily. 11/07/17  Yes Bethel Born, PA-C  amoxicillin (AMOXIL) 875 MG tablet Take 1 tablet (875 mg total) by mouth 2 (two) times daily. 03/17/18    Elvina Sidle, MD  amoxicillin-clavulanate (AUGMENTIN) 875-125 MG tablet Take 1 tablet by mouth every 12 (twelve) hours for 10 days. 04/02/18 04/12/18  Dagoberto Nealy, [place], PA-C  fluticasone (FLONASE) 50 MCG/ACT nasal spray Place 2 sprays daily into both nostrils. 10/07/17   Cathie Hoops, Amy V, PA-C  naproxen (NAPROSYN) 500 MG tablet Take 1 tablet (500 mg total) by mouth 2 (two) times daily with a meal. 03/06/17   Muthersbaugh, Dahlia Client, PA-C  omeprazole (PRILOSEC) 20 MG capsule Take 20 mg by mouth daily.    [provider]  predniSONE (DELTASONE) 20 MG tablet Take 1 tablet (20 mg total) by mouth daily with lunch. Two daily with food 03/17/18   Elvina Sidle, MD    Family History Family History  Problem Relation Age of Onset  . Hypertension Father   . Healthy Mother     Social History Social History   Tobacco Use  . Smoking status: Never Smoker  . Smokeless tobacco: Never Used  Substance Use Topics  . Alcohol use: No    Comment: occasional  . Drug use: No     Allergies   Patient has no known allergies.   Review of Systems Review of Systems  Constitutional: Positive for chills. Negative for fatigue and fever.  HENT: Positive for ear pain, sinus pressure and sinus pain. Negative for  congestion, rhinorrhea, sneezing and sore throat.   Eyes: Negative for itching.  Respiratory: Negative for cough and shortness of breath.   Cardiovascular: Negative for chest pain.  Gastrointestinal: Negative for abdominal pain, constipation, diarrhea, nausea and vomiting.  Genitourinary: Negative for difficulty urinating and dyspareunia.  Musculoskeletal: Negative for myalgias.  Skin: Negative for rash.     Physical Exam Triage Vital Signs ED Triage Vitals [04/02/18 1034]  Enc Vitals Group     BP (!) 149/84     Pulse Rate 85     Resp 18     Temp 99 F (37.2 C)     Temp src      SpO2 100 %     Weight 196 lb (88.9 kg)     Height      Head Circumference      Peak Flow      Pain Score 0      Pain Loc      Pain Edu?      Excl. in GC?    No data found.  Updated Vital Signs BP (!) 149/84   Pulse 85   Temp 99 F (37.2 C)   Resp 18   Wt 196 lb (88.9 kg)   LMP 03/07/2018   SpO2 100%   BMI 33.64 kg/m   Physical Exam  Constitutional: She is oriented to person, place, and time. She appears well-developed and well-nourished. No distress.  HENT:  Head: Normocephalic and atraumatic.  Right Ear: External ear normal.  Left Ear: External ear normal.  Nose: Nose normal.  Mouth/Throat: Oropharynx is clear and moist. No oropharyngeal exudate.  Frontal and maxillary sinus tenderness  Eyes: Pupils are equal, round, and reactive to light. EOM are normal. No scleral icterus.  Neck: Normal range of motion. Neck supple.  Cardiovascular: Normal rate and regular rhythm. Exam reveals no friction rub.  No murmur heard. Radial pulse 2+ bilaterally    Pulmonary/Chest: Effort normal and breath sounds normal. No stridor. No respiratory distress. She has no wheezes. She has no rales.  CTA bilaterally  Abdominal: Soft. Bowel sounds are normal. There is no tenderness. There is no guarding.  Lymphadenopathy:    She has no cervical adenopathy.  Neurological: She is alert and oriented to person, place, and time.  Skin: Skin is warm and dry. Capillary refill takes 2 to 3 seconds. She is not diaphoretic.  Psychiatric: She has a normal mood and affect. Her behavior is normal. Judgment and thought content normal.  Nursing note and vitals reviewed.    UC Treatments / Results  Labs (all labs ordered are listed, but only abnormal results are displayed) Labs Reviewed - No data to display  EKG None  Radiology No results found.  Procedures Procedures (including critical care time)  Medications Ordered in UC Medications - No data to display  Initial Impression / Assessment and Plan / UC Course  I have reviewed the triage vital signs and the nursing notes.  Pertinent labs & imaging  results that were available during my care of the patient were reviewed by me and considered in my medical decision making (see chart for details).     Patient hx, symptoms, and PE consistent with sinus infection.  Was treated with amoxicillin 875 bid for 10 days 2 weeks ago.  Patient denies improvement in symptoms.  Prescribed augmentin 875 bid for 10 days.  Will use OTC tylenol and flonase as needed for symptomatic relief.  Will follow up with PCP if symptoms persists  Patient also complains of "vertigo."  Patient states she was told this is a side effect of her medication metoprolol which she takes for HTN.  I discussed with the patient that I could treat her for vertigo, but if this was a side effect of her medication, she should consult her PCP regarding changing the dose or medication she is currently taking for HTN.  Patient has an appointment on 04/16/18, but I recommended she call her PCP today to let them know the side effect she is experiencing.  Patient is aware and in agreement with this plan.  Return and ER precautions given.   Final Clinical Impressions(s) / UC Diagnoses   Final diagnoses:  Subacute maxillary sinusitis     Discharge Instructions     Drink water and get plenty of rest Take antibiotic as prescribed Use OTC tylenol and Flonase as needed for symptomatic relief Follow up with PCP regarding blood pressure medication and if symptoms persists Return or go to ER if you have any new or worsening symptoms    ED Prescriptions    Medication Sig Dispense Auth. Provider   amoxicillin-clavulanate (AUGMENTIN) 875-125 MG tablet Take 1 tablet by mouth every 12 (twelve) hours for 10 days. 20 tablet Alvino Chapel, [place], PA-C     Controlled Substance Prescriptions [place] Controlled Substance Registry consulted? Not Applicable   Rennis Harding, [place] 04/02/18 1149

## 2018-04-02 NOTE — ED Triage Notes (Signed)
Pt presents with sinus infection symptoms. Reports just being seen 2 weeks ago and was given antibiotics for the same. Reports weakness and tremors in both arms.

## 2018-04-02 NOTE — Discharge Instructions (Addendum)
Drink water and get plenty of rest Take antibiotic as prescribed Use OTC tylenol and Flonase as needed for symptomatic relief Follow up with PCP regarding blood pressure medication and if symptoms persists Return or go to ER if you have any new or worsening symptoms

## 2018-05-09 ENCOUNTER — Ambulatory Visit (HOSPITAL_COMMUNITY)
Admission: EM | Admit: 2018-05-09 | Discharge: 2018-05-09 | Disposition: A | Discharge status: Home / Self Care | Payer: Self-pay | Attending: Family Medicine | Admitting: Family Medicine

## 2018-05-09 ENCOUNTER — Encounter (HOSPITAL_COMMUNITY): Payer: Self-pay | Admitting: Emergency Medicine

## 2018-05-09 DIAGNOSIS — R1013 Epigastric pain: Secondary | ICD-10-CM

## 2018-05-09 DIAGNOSIS — K30 Functional dyspepsia: Secondary | ICD-10-CM

## 2018-05-09 DIAGNOSIS — K319 Disease of stomach and duodenum, unspecified: Secondary | ICD-10-CM

## 2018-05-09 MED ORDER — PANTOPRAZOLE SODIUM 40 MG PO TBEC: 40.0000 mg | DELAYED_RELEASE_TABLET | Freq: Every day | ORAL | 0 refills | Status: DC

## 2018-05-09 MED ORDER — PANTOPRAZOLE SODIUM 20 MG PO TBEC: 20.0000 mg | DELAYED_RELEASE_TABLET | Freq: Every day | ORAL | 0 refills | Status: DC

## 2018-05-09 MED ORDER — GI COCKTAIL ~~LOC~~
ORAL | Status: AC
  Filled 2018-05-09: qty 30

## 2018-05-09 MED ORDER — GI COCKTAIL ~~LOC~~
30.0000 mL | Freq: Once | ORAL | Status: AC
  Administered 2018-05-09: 30 mL via ORAL

## 2018-05-09 NOTE — Discharge Instructions (Signed)
May try Gaviscon

## 2018-05-09 NOTE — ED Triage Notes (Signed)
Pt c/o bad acid reflux, takes omeprazole, zantac, and tums and its been really bad.

## 2018-05-09 NOTE — ED Provider Notes (Signed)
[facility]    CSN: [id number] Arrival date & time: 05/09/18  1315     History   Chief Complaint No chief complaint on file.   HPI Meredith Yoder is a 32 y.o. female.   Patient has a history of acid peptic disease.  She is followed by a gastroenterologist and takes Zantac as well as omeprazole.  Symptoms are worsening and she is getting no relief.  She does have follow-up appointment with GI in 5 days.  She does not use caffeine alcohol aspirin or NSAIDs.  HPI  Past Medical History:  Diagnosis Date  . Hypertension   . Murmur     Patient Active Problem List   Diagnosis Date Noted  . Chest pain 12/21/2016  . HTN (hypertension) 12/21/2016  . Tachycardia 12/21/2016  . Abnormal thyroid exam 12/21/2016    Past Surgical History:  Procedure Laterality Date  . DENTAL SURGERY      OB History   None      Home Medications    Prior to Admission medications   Medication Sig Start Date End Date Taking? Authorizing Provider  amoxicillin (AMOXIL) 875 MG tablet Take 1 tablet (875 mg total) by mouth 2 (two) times daily. 03/17/18   Elvina Sidle, MD  fexofenadine (ALLEGRA) 60 MG tablet Take 60 mg by mouth 2 (two) times daily.    [provider]  fluticasone (FLONASE) 50 MCG/ACT nasal spray Place 2 sprays daily into both nostrils. 10/07/17   Cathie Hoops, Amy V, PA-C  metoprolol succinate (TOPROL-XL) 50 MG 24 hr tablet Take 50 mg by mouth daily. Take with or immediately following a meal.    [provider]  naproxen (NAPROSYN) 500 MG tablet Take 1 tablet (500 mg total) by mouth 2 (two) times daily with a meal. 03/06/17   Muthersbaugh, Dahlia Client, PA-C  omeprazole (PRILOSEC) 20 MG capsule Take 20 mg by mouth daily.    [provider]  pantoprazole (PROTONIX) 40 MG tablet Take 1 tablet (40 mg total) by mouth daily. 11/07/17   Bethel Born, PA-C  predniSONE (DELTASONE) 20 MG tablet Take 1 tablet (20 mg total) by mouth daily with lunch. Two daily with  food 03/17/18   Elvina Sidle, MD    Family History Family History  Problem Relation Age of Onset  . Hypertension Father   . Healthy Mother     Social History Social History   Tobacco Use  . Smoking status: Never Smoker  . Smokeless tobacco: Never Used  Substance Use Topics  . Alcohol use: No    Comment: occasional  . Drug use: No     Allergies   Patient has no known allergies.   Review of Systems Review of Systems  Constitutional: Negative.   HENT: Negative.   Respiratory: Negative.   Cardiovascular: Negative.   Gastrointestinal: Positive for abdominal pain.  Endocrine: Negative.   Genitourinary: Negative.      Physical Exam Triage Vital Signs ED Triage Vitals [05/09/18 1406]  Enc Vitals Group     BP 115/76     Pulse Rate 94     Resp 16     Temp 98.2 F (36.8 C)     Temp Source Oral     SpO2 98 %     Weight      Height      Head Circumference      Peak Flow      Pain Score      Pain Loc  Pain Edu?      Excl. in GC?    No data found.  Updated Vital Signs BP 115/76 (BP Location: Left Arm)   Pulse 94   Temp 98.2 F (36.8 C) (Oral)   Resp 16   SpO2 98%   Visual Acuity Right Eye Distance:   Left Eye Distance:   Bilateral Distance:    Right Eye Near:   Left Eye Near:    Bilateral Near:     Physical Exam  Constitutional: She appears well-developed and well-nourished.  HENT:  Head: Normocephalic.  Cardiovascular: Normal rate and regular rhythm.  Pulmonary/Chest: Effort normal and breath sounds normal.  Abdominal: Soft. There is no tenderness.     UC Treatments / Results  Labs (all labs ordered are listed, but only abnormal results are displayed) Labs Reviewed - No data to display  EKG None  Radiology No results found.  Procedures Procedures (including critical care time)  Medications Ordered in UC Medications  gi cocktail (Maalox,Lidocaine,Donnatal) (30 mLs Oral Given 05/09/18 1416)    Initial Impression /  Assessment and Plan / UC Course  I have reviewed the triage vital signs and the nursing notes.  Pertinent labs & imaging results that were available during my care of the patient were reviewed by me and considered in my medical decision making (see chart for details).     Acid peptic disease.  Since she is being followed by GI will withhold further testing such as test for H. pylori, ZE, etc. will give her different PPI.  Have also recommended trial of OTC Gaviscon Final Clinical Impressions(s) / UC Diagnoses   Final diagnoses:  None   Discharge Instructions   None    ED Prescriptions    None     Controlled Substance Prescriptions [place] Controlled Substance Registry consulted? No   Frederica KusterMiller, Stephen M, MD 05/09/18 [phone]

## 2018-05-17 ENCOUNTER — Other Ambulatory Visit: Payer: Self-pay | Admitting: Family Medicine

## 2018-05-17 DIAGNOSIS — E041 Nontoxic single thyroid nodule: Secondary | ICD-10-CM

## 2018-05-26 ENCOUNTER — Other Ambulatory Visit: Payer: Self-pay

## 2018-05-27 ENCOUNTER — Ambulatory Visit
Admission: RE | Admit: 2018-05-27 | Discharge: 2018-05-27 | Disposition: A | Discharge status: Home / Self Care | Payer: No Typology Code available for payment source | Source: Ambulatory Visit | Attending: Family Medicine | Admitting: Family Medicine

## 2018-05-27 DIAGNOSIS — E041 Nontoxic single thyroid nodule: Secondary | ICD-10-CM

## 2018-11-09 ENCOUNTER — Ambulatory Visit (HOSPITAL_COMMUNITY)
Admission: EM | Admit: 2018-11-09 | Discharge: 2018-11-09 | Disposition: A | Discharge status: Home / Self Care | Payer: BC Managed Care – PPO | Attending: Family Medicine | Admitting: Family Medicine

## 2018-11-09 ENCOUNTER — Encounter (HOSPITAL_COMMUNITY): Payer: Self-pay

## 2018-11-09 ENCOUNTER — Ambulatory Visit (INDEPENDENT_AMBULATORY_CARE_PROVIDER_SITE_OTHER): Payer: BC Managed Care – PPO

## 2018-11-09 DIAGNOSIS — K5900 Constipation, unspecified: Secondary | ICD-10-CM

## 2018-11-09 DIAGNOSIS — Z3202 Encounter for pregnancy test, result negative: Secondary | ICD-10-CM | POA: Diagnosis not present

## 2018-11-09 DIAGNOSIS — R350 Frequency of micturition: Secondary | ICD-10-CM

## 2018-11-09 LAB — POCT URINALYSIS DIP (DEVICE)
BILIRUBIN URINE: NEGATIVE
Glucose, UA: NEGATIVE mg/dL
Hgb urine dipstick: NEGATIVE
Ketones, ur: NEGATIVE mg/dL
Leukocytes, UA: NEGATIVE
NITRITE: NEGATIVE
PH: 7 (ref 5.0–8.0)
PROTEIN: NEGATIVE mg/dL
Specific Gravity, Urine: 1.02 (ref 1.005–1.030)
Urobilinogen, UA: 0.2 mg/dL (ref 0.0–1.0)

## 2018-11-09 LAB — POCT PREGNANCY, URINE: PREG TEST UR: NEGATIVE

## 2018-11-09 MED ORDER — DOCUSATE SODIUM 50 MG PO CAPS: 50.0000 mg | ORAL_CAPSULE | Freq: Two times a day (BID) | ORAL | 0 refills | Status: DC

## 2018-11-09 MED ORDER — GLYCERIN (ADULT) 2 G RE SUPP: 1.0000 | RECTAL | 0 refills | Status: DC | PRN

## 2018-11-09 NOTE — ED Triage Notes (Signed)
Pt present constipation that started on Tuesday.  Pt has tried OTC medication for some relief but no progress.

## 2018-11-09 NOTE — Discharge Instructions (Addendum)
Please use Miralax for moderate to severe constipation. Take this once a day for the next 2-3 days. Please also start docusate stool softener, twice a day for at least 1 week. If stools become loose, cut down to once a day for another week. If stools remain loose, cut back to 1 pill every other day for a third week. You can stop docusate thereafter and resume as needed for constipation.  May try suppository twice daily or enema to help with stool at rectum  Magnesium citrate as alternative if above recomendations not helping symptoms  To help reduce constipation and promote bowel health: 1. Drink at least 64 ounces of water each day 2. Eat plenty of fiber (fruits, vegetables, whole grains, legumes) 3. Be physically active or exercise including walking, jogging, swimming, yoga, etc. 4. For active constipation use a stool softener (docusate) or an osmotic laxative (like Miralax) each day, or as needed.

## 2018-11-10 NOTE — ED Provider Notes (Signed)
[facility]    CSN: [id number] Arrival date & time: 11/09/18  0807     History   Chief Complaint Chief Complaint  Patient presents with  . Constipation    HPI Meredith Yoder is a 32 y.o. female history of hypertension presenting today for evaluation of constipation.  Patient states that over the past week/Tuesday she has had discomfort in her lower back as well as discomfort near her rectum.  She has felt as if there is something the past but that she cannot pass it.  She has tried MiraLAX twice as well as tried docusate without relief.  She tried a Fleet enema yesterday, but states that she is unsure if all the fluid went into the rectum.  She denies previous issues with constipation.  Last menstrual.  Ended around December 3.  She has had slight increase in frequency, but denies dysuria.  Denies history of kidney stones.  Denies hematuria.  Has any injury or increase in activity or heavy lifting to injure her back.  Denies worsening of pain with movement.  Pain worsens with lying flat.  Denies chest pain or shortness of breath.  Denies fevers.  Denies nausea or vomiting.  HPI  Past Medical History:  Diagnosis Date  . Hypertension   . Murmur     Patient Active Problem List   Diagnosis Date Noted  . Chest pain 12/21/2016  . HTN (hypertension) 12/21/2016  . Tachycardia 12/21/2016  . Abnormal thyroid exam 12/21/2016    Past Surgical History:  Procedure Laterality Date  . DENTAL SURGERY      OB History   None      Home Medications    Prior to Admission medications   Medication Sig Start Date End Date Taking? Authorizing Provider  docusate sodium (COLACE) 50 MG capsule Take 1 capsule (50 mg total) by mouth 2 (two) times daily. 11/09/18   Wieters, Hallie C, PA-C  fexofenadine (ALLEGRA) 60 MG tablet Take 60 mg by mouth 2 (two) times daily.    [provider]  fluticasone (FLONASE) 50 MCG/ACT nasal spray Place 2 sprays daily into both nostrils.  10/07/17   Cathie Hoops, Amy V, PA-C  glycerin adult 2 g suppository Place 1 suppository rectally as needed for constipation. 11/09/18   Wieters, Hallie C, PA-C  metoprolol succinate (TOPROL-XL) 50 MG 24 hr tablet Take 50 mg by mouth daily. Take with or immediately following a meal.    [provider]  naproxen (NAPROSYN) 500 MG tablet Take 1 tablet (500 mg total) by mouth 2 (two) times daily with a meal. 03/06/17   Muthersbaugh, Dahlia Client, PA-C  pantoprazole (PROTONIX) 20 MG tablet Take 1 tablet (20 mg total) by mouth daily. 05/09/18   Frederica Kuster, MD  pantoprazole (PROTONIX) 40 MG tablet Take 1 tablet (40 mg total) by mouth daily. 05/09/18   Frederica Kuster, MD    Family History Family History  Problem Relation Age of Onset  . Hypertension Father   . Healthy Mother     Social History Social History   Tobacco Use  . Smoking status: Never Smoker  . Smokeless tobacco: Never Used  Substance Use Topics  . Alcohol use: No    Comment: occasional  . Drug use: No     Allergies   Patient has no known allergies.   Review of Systems Review of Systems  Constitutional: Negative for fever.  Respiratory: Negative for shortness of breath.   Cardiovascular: Negative for chest pain.  Gastrointestinal:  Positive for constipation. Negative for abdominal pain, blood in stool, diarrhea, nausea, rectal pain and vomiting.  Genitourinary: Positive for frequency. Negative for dysuria, flank pain, genital sores, hematuria, menstrual problem, vaginal bleeding, vaginal discharge and vaginal pain.  Musculoskeletal: Positive for back pain.  Skin: Negative for rash.  Neurological: Negative for dizziness, light-headedness and headaches.     Physical Exam Triage Vital Signs ED Triage Vitals [11/09/18 0831]  Enc Vitals Group     BP (!) 142/92     Pulse Rate 91     Resp 18     Temp 97.9 F (36.6 C)     Temp Source Oral     SpO2 99 %     Weight      Height      Head Circumference      Peak Flow        Pain Score 0     Pain Loc      Pain Edu?      Excl. in GC?    No data found.  Updated Vital Signs BP (!) 142/92 (BP Location: Right Arm)   Pulse 91   Temp 97.9 F (36.6 C) (Oral)   Resp 18   LMP 11/05/2018   SpO2 99%   Visual Acuity Right Eye Distance:   Left Eye Distance:   Bilateral Distance:    Right Eye Near:   Left Eye Near:    Bilateral Near:     Physical Exam  Constitutional: She appears well-developed and well-nourished. No distress.  HENT:  Head: Normocephalic and atraumatic.  Eyes: Conjunctivae are normal.  Neck: Neck supple.  Cardiovascular: Normal rate and regular rhythm.  No murmur heard. Pulmonary/Chest: Effort normal and breath sounds normal. No respiratory distress.  Abdominal: Soft. There is no tenderness.  Abdomen soft, nondistended, nontender To light and deep palpation throughout abdomen  Musculoskeletal: She exhibits no edema.  Nontender to palpation of cervical, thoracic and lumbar spine midline, mild tenderness to palpation over the lateral lumbar musculature  Neurological: She is alert.  Skin: Skin is warm and dry.  Psychiatric: She has a normal mood and affect.  Nursing note and vitals reviewed.    UC Treatments / Results  Labs (all labs ordered are listed, but only abnormal results are displayed) Labs Reviewed  POC URINE PREG, ED  POCT URINALYSIS DIP (DEVICE)  POCT PREGNANCY, URINE    EKG None  Radiology Dg Abd 2 Views  Result Date: 11/09/2018 CLINICAL DATA:  One week of constipation unresponsive to laxative or stool softeners. EXAM: ABDOMEN - 2 VIEW COMPARISON:  None. FINDINGS: The colonic stool burden is moderate. There is some stool in the rectum. No findings are seen to suggest a small or large bowel obstruction nor fecal impaction. There are no abnormal soft tissue calcifications. There are numerous pelvic phleboliths. The bony structures exhibit no acute abnormalities. IMPRESSION: Moderate colonic stool burden may  reflect constipation in the appropriate clinical setting. No evidence of obstruction or fecal impaction. Electronically Signed   By: David  [place] M.D.   On: 11/09/2018 09:25    Procedures Procedures (including critical care time)  Medications Ordered in UC Medications - No data to display  Initial Impression / Assessment and Plan / UC Course  I have reviewed the triage vital signs and the nursing notes.  Pertinent labs & imaging results that were available during my care of the patient were reviewed by me and considered in my medical decision making (see chart for details).  UA negative for signs of UTI, pregnancy negative. Abdominal x-ray showing stool near her rectum as well as significant stool burden in right side of the colon.  Will recommend further recommendations for constipation, recommended enema or suppositories to use to help with stool near her rectum, I recommended continuing MiraLAX and docusate for stool on right side of colon.  Discussed lifestyle modifications as well.  Advised that she may try mag citrate as well, but recommended not to use all of these at the same time.  Continue to monitor discomfort and stools,Discussed strict return precautions. Patient verbalized understanding and is agreeable with plan.  Final Clinical Impressions(s) / UC Diagnoses   Final diagnoses:  Constipation, unspecified constipation type     Discharge Instructions     Please use Miralax for moderate to severe constipation. Take this once a day for the next 2-3 days. Please also start docusate stool softener, twice a day for at least 1 week. If stools become loose, cut down to once a day for another week. If stools remain loose, cut back to 1 pill every other day for a third week. You can stop docusate thereafter and resume as needed for constipation.  May try suppository twice daily or enema to help with stool at rectum  Magnesium citrate as alternative if above recomendations not  helping symptoms  To help reduce constipation and promote bowel health: 1. Drink at least 64 ounces of water each day 2. Eat plenty of fiber (fruits, vegetables, whole grains, legumes) 3. Be physically active or exercise including walking, jogging, swimming, yoga, etc. 4. For active constipation use a stool softener (docusate) or an osmotic laxative (like Miralax) each day, or as needed.   ED Prescriptions    Medication Sig Dispense Auth. Provider   docusate sodium (COLACE) 50 MG capsule Take 1 capsule (50 mg total) by mouth 2 (two) times daily. 16 capsule Wieters, Hallie C, PA-C   glycerin adult 2 g suppository Place 1 suppository rectally as needed for constipation. 16 suppository Wieters, Hallie C, PA-C     Controlled Substance Prescriptions [place] Controlled Substance Registry consulted? Not Applicable   Lew DawesWieters, Hallie C, [place]-C 11/10/18 1035

## 2019-04-22 ENCOUNTER — Other Ambulatory Visit: Payer: Self-pay | Admitting: Family Medicine

## 2019-04-22 ENCOUNTER — Ambulatory Visit
Admission: RE | Admit: 2019-04-22 | Discharge: 2019-04-22 | Disposition: A | Discharge status: Home / Self Care | Payer: BC Managed Care – PPO | Source: Ambulatory Visit | Attending: Family Medicine | Admitting: Family Medicine

## 2019-04-22 DIAGNOSIS — M25512 Pain in left shoulder: Secondary | ICD-10-CM

## 2019-05-02 ENCOUNTER — Other Ambulatory Visit: Payer: Self-pay | Admitting: Family Medicine

## 2019-05-02 DIAGNOSIS — E041 Nontoxic single thyroid nodule: Secondary | ICD-10-CM

## 2019-05-16 ENCOUNTER — Other Ambulatory Visit: Payer: Self-pay

## 2019-05-16 ENCOUNTER — Ambulatory Visit
Admission: RE | Admit: 2019-05-16 | Discharge: 2019-05-16 | Disposition: A | Discharge status: Home / Self Care | Payer: BC Managed Care – PPO | Source: Ambulatory Visit | Attending: Family Medicine | Admitting: Family Medicine

## 2019-05-16 DIAGNOSIS — E041 Nontoxic single thyroid nodule: Secondary | ICD-10-CM

## 2019-09-29 ENCOUNTER — Encounter (HOSPITAL_COMMUNITY): Payer: Self-pay | Admitting: Emergency Medicine

## 2019-09-29 ENCOUNTER — Other Ambulatory Visit: Payer: Self-pay

## 2019-09-29 ENCOUNTER — Ambulatory Visit (HOSPITAL_COMMUNITY)
Admission: EM | Admit: 2019-09-29 | Discharge: 2019-09-29 | Disposition: A | Discharge status: Home / Self Care | Payer: BC Managed Care – PPO | Attending: Family Medicine | Admitting: Family Medicine

## 2019-09-29 DIAGNOSIS — I10 Essential (primary) hypertension: Secondary | ICD-10-CM

## 2019-09-29 DIAGNOSIS — M25522 Pain in left elbow: Secondary | ICD-10-CM

## 2019-09-29 DIAGNOSIS — R000 Tachycardia, unspecified: Secondary | ICD-10-CM

## 2019-09-29 MED ORDER — DICLOFENAC SODIUM 75 MG PO TBEC: 75.0000 mg | DELAYED_RELEASE_TABLET | Freq: Two times a day (BID) | ORAL | 0 refills | Status: DC

## 2019-09-29 NOTE — ED Triage Notes (Signed)
Left arm pain, feels like hand puffy when she wakes up, pain into fingers.  Patient sleeps on this left side and recently has been lifting heavy things

## 2019-10-04 NOTE — ED Provider Notes (Signed)
[name]   [id number] 09/29/19 Arrival Time: 9892  ASSESSMENT & PLAN:  1. Left elbow pain   2. Essential hypertension   3. Tachycardia     Recheck pulse 108 and regular. She attributes tachycardia to being very nervous at medical facilities. No associated symptoms. She has not taken HTN medication today. Able to monitor BP at home. May f/u with PCP if BP remains elevated.  No indication for imaging of left elbow. Discussed. Likely inflammatory. Encourage ROM as she tolerates.  To begin trial of: Meds ordered this encounter  Medications  . diclofenac (VOLTAREN) 75 MG EC tablet    Sig: Take 1 tablet (75 mg total) by mouth 2 (two) times daily.    Dispense:  14 tablet    Refill:  0    No orders of the defined types were placed in this encounter.   Recommend: Follow-up Information    [facility].   Specialty: Urgent Care Why: If worsening or failing to improve as anticipated. Contact information: [place] [phone]          Reviewed expectations re: course of current medical issues. Questions answered. Outlined signs and symptoms indicating need for more acute intervention. Patient verbalized understanding. After Visit Summary given.  SUBJECTIVE: History from: patient. Meredith Yoder is a 33 y.o. female who reports intermittent mild to moderate pain of her left arm, specifically around her elbow; described as aching; "sometimes feel the pain go to my hand and fingers. Onset: gradual. First noted: over the past several days; noticed after lifting heavy items. Injury/trama: none reported. Symptoms have progressed to a point and plateaued since beginning. Aggravating factors: certain movements and grasping. Alleviating factors: rest. Associated symptoms: none reported. Extremity sensation changes or weakness: none. Self treatment: has not tried OTC therapies.  History  of similar: no.  Past Surgical History:  Procedure Laterality Date  . DENTAL SURGERY      Increased blood pressure noted today. Reports that she is on treatment for HTN.  She reports no chest pain on exertion, no dyspnea on exertion, no swelling of ankles, no orthostatic dizziness or lightheadedness, no orthopnea or paroxysmal nocturnal dyspnea, no palpitations and no intermittent claudication symptoms.  ROS: As per HPI. All other systems negative.    OBJECTIVE:  Vitals:   09/29/19 1810 09/29/19 1836  BP: (!) 153/94   Pulse: (!) 124 (!) 124  Resp: 16   Temp: 98.1 F (36.7 C)   TempSrc: Temporal   SpO2: 100% 99%     General appearance: alert; no distress HEENT: [place]; AT Neck: supple with FROM CV: tachycardia; regular Resp: unlabored respirations Extremities: . LUE: warm with well perfused appearance; fairly well localized mild to moderate tenderness around left medial epicondyle; without gross deformities; swelling: none; bruising: none; elbow ROM: normal with reported mild discomfort; no overlying skin erythema; joint is normal skin temperature CV: brisk extremity capillary refill of LUE; 2+ radial pulse of LUE. Skin: warm and dry; no visible rashes Neurologic: gait normal; normal reflexes of bilateral UE; normal sensation of bilateral UE; normal strength of bilateral UE Psychological: alert and cooperative; normal mood and affect   No Known Allergies  Past Medical History:  Diagnosis Date  . Hypertension   . Murmur    Social History   Socioeconomic History  . Marital status: Single    Spouse name: Not on file  . Number of children: Not on file  .  Years of education: Not on file  . Highest education level: Not on file  Occupational History  . Not on file  Social Needs  . Financial resource strain: Not on file  . Food insecurity    Worry: Not on file    Inability: Not on file  . Transportation needs    Medical: Not on file    Non-medical: Not on file   Tobacco Use  . Smoking status: Never Smoker  . Smokeless tobacco: Never Used  Substance and Sexual Activity  . Alcohol use: No    Comment: occasional  . Drug use: No  . Sexual activity: Yes    Birth control/protection: None  Lifestyle  . Physical activity    Days per week: Not on file    Minutes per session: Not on file  . Stress: Not on file  Relationships  . Social Musician on phone: Not on file    Gets together: Not on file    Attends religious service: Not on file    Active member of club or organization: Not on file    Attends meetings of clubs or organizations: Not on file    Relationship status: Not on file  Other Topics Concern  . Not on file  Social History Narrative  . Not on file   Family History  Problem Relation Age of Onset  . Hypertension Father   . Healthy Mother    Past Surgical History:  Procedure Laterality Date  . DENTAL SURGERY        Meredith Layman, MD 10/04/19 [phone]

## 2019-11-29 ENCOUNTER — Other Ambulatory Visit (HOSPITAL_COMMUNITY): Payer: Self-pay | Admitting: Gastroenterology

## 2019-11-29 ENCOUNTER — Other Ambulatory Visit: Payer: Self-pay | Admitting: Gastroenterology

## 2019-11-29 DIAGNOSIS — R110 Nausea: Secondary | ICD-10-CM

## 2019-11-29 DIAGNOSIS — R140 Abdominal distension (gaseous): Secondary | ICD-10-CM

## 2019-12-12 ENCOUNTER — Encounter (HOSPITAL_COMMUNITY): Payer: Self-pay

## 2019-12-12 ENCOUNTER — Encounter (HOSPITAL_COMMUNITY): Payer: BC Managed Care – PPO

## 2020-08-23 ENCOUNTER — Ambulatory Visit (INDEPENDENT_AMBULATORY_CARE_PROVIDER_SITE_OTHER): Payer: BC Managed Care – PPO | Admitting: Otolaryngology

## 2020-08-27 ENCOUNTER — Ambulatory Visit (INDEPENDENT_AMBULATORY_CARE_PROVIDER_SITE_OTHER): Payer: BC Managed Care – PPO | Admitting: Otolaryngology

## 2020-11-01 ENCOUNTER — Ambulatory Visit
Admission: EM | Admit: 2020-11-01 | Discharge: 2020-11-01 | Disposition: A | Discharge status: Home / Self Care | Payer: BC Managed Care – PPO | Attending: Emergency Medicine | Admitting: Emergency Medicine

## 2020-11-01 ENCOUNTER — Encounter: Payer: Self-pay | Admitting: Emergency Medicine

## 2020-11-01 ENCOUNTER — Other Ambulatory Visit: Payer: Self-pay

## 2020-11-01 DIAGNOSIS — M654 Radial styloid tenosynovitis [de Quervain]: Secondary | ICD-10-CM

## 2020-11-01 MED ORDER — NAPROXEN 500 MG PO TABS: 500.0000 mg | ORAL_TABLET | Freq: Two times a day (BID) | ORAL | 0 refills | Status: DC

## 2020-11-01 NOTE — Discharge Instructions (Signed)
Wear wrist brace for the next 1 to 2 weeks to limit motion triggering pain and swelling May continue to ice Naprosyn twice daily for pain Follow-up if not improving or worsening

## 2020-11-01 NOTE — ED Provider Notes (Signed)
[facility]    CSN: [id number] Arrival date & time: 11/01/20  1401      History   Chief Complaint Chief Complaint  Patient presents with  . Hand Pain    swelling    HPI Meredith Yoder is a 34 y.o. female history of hypertension presenting today for evaluation of right hand swelling. Symptoms for 5 days, Denies specific injury, but does report frequent use of hands. Right-handed. Denies history of similar.  Reports increased use of hands at work recently between holidays.  Denies history of gout.  HPI  Past Medical History:  Diagnosis Date  . Hypertension   . Murmur     Patient Active Problem List   Diagnosis Date Noted  . Chest pain 12/21/2016  . HTN (hypertension) 12/21/2016  . Tachycardia 12/21/2016  . Abnormal thyroid exam 12/21/2016    Past Surgical History:  Procedure Laterality Date  . DENTAL SURGERY      OB History   No obstetric history on file.      Home Medications    Prior to Admission medications   Medication Sig Start Date End Date Taking? Authorizing Provider  amLODipine (NORVASC) 5 MG tablet Take 5 mg by mouth daily.    [provider]  naproxen (NAPROSYN) 500 MG tablet Take 1 tablet (500 mg total) by mouth 2 (two) times daily. 11/01/20   Tomi Paddock C, PA-C  fexofenadine (ALLEGRA) 60 MG tablet Take 60 mg by mouth 2 (two) times daily.  09/29/19  [provider]  fluticasone (FLONASE) 50 MCG/ACT nasal spray Place 2 sprays daily into both nostrils. 10/07/17 09/29/19  Cathie Hoops, Amy V, PA-C  metoprolol succinate (TOPROL-XL) 50 MG 24 hr tablet Take 50 mg by mouth daily. Take with or immediately following a meal.  09/29/19  [provider]  pantoprazole (PROTONIX) 40 MG tablet Take 1 tablet (40 mg total) by mouth daily. 05/09/18 09/29/19  Frederica Kuster, MD    Family History Family History  Problem Relation Age of Onset  . Hypertension Father   . Healthy Mother     Social History Social History    Tobacco Use  . Smoking status: Never Smoker  . Smokeless tobacco: Never Used  Substance Use Topics  . Alcohol use: No    Comment: occasional  . Drug use: No     Allergies   Patient has no known allergies.   Review of Systems Review of Systems  Constitutional: Negative for fatigue and fever.  Eyes: Negative for visual disturbance.  Respiratory: Negative for shortness of breath.   Cardiovascular: Negative for chest pain.  Gastrointestinal: Negative for abdominal pain, nausea and vomiting.  Musculoskeletal: Positive for joint swelling. Negative for arthralgias.  Skin: Negative for color change, rash and wound.  Neurological: Negative for dizziness, weakness, light-headedness and headaches.     Physical Exam Triage Vital Signs ED Triage Vitals  Enc Vitals Group     BP      Pulse      Resp      Temp      Temp src      SpO2      Weight      Height      Head Circumference      Peak Flow      Pain Score      Pain Loc      Pain Edu?      Excl. in GC?    No data found.  Updated Vital Signs  BP 133/70 (BP Location: Right Arm)   Pulse 99   Temp 98.5 F (36.9 C) (Oral)   Resp 16   Ht 5\' 5"  (1.651 m)   Wt 258 lb (117 kg)   SpO2 100%   BMI 42.93 kg/m   Visual Acuity Right Eye Distance:   Left Eye Distance:   Bilateral Distance:    Right Eye Near:   Left Eye Near:    Bilateral Near:     Physical Exam Vitals and nursing note reviewed.  Constitutional:      Appearance: She is well-developed.     Comments: No acute distress  HENT:     Head: Normocephalic and atraumatic.     Nose: Nose normal.  Eyes:     Conjunctiva/sclera: Conjunctivae normal.  Cardiovascular:     Rate and Rhythm: Normal rate.  Pulmonary:     Effort: Pulmonary effort is normal. No respiratory distress.  Abdominal:     General: There is no distension.  Musculoskeletal:        General: Normal range of motion.     Cervical back: Neck supple.     Comments: Right hand: Mild swelling  on dorsum of hand compared to left, no overlying erythema or warmth, tender to palpation of first metacarpal and interdigital tissue between first and second metacarpal, weakly positive Finkelstein's, negative Tinel's, full active range of motion of all 5 fingers and wrist, sensation intact distally, radial pulse 2+  Skin:    General: Skin is warm and dry.  Neurological:     Mental Status: She is alert and oriented to person, place, and time.      UC Treatments / Results  Labs (all labs ordered are listed, but only abnormal results are displayed) Labs Reviewed - No data to display  EKG   Radiology No results found.  Procedures Procedures (including critical care time)  Medications Ordered in UC Medications - No data to display  Initial Impression / Assessment and Plan / UC Course  I have reviewed the triage vital signs and the nursing notes.  Pertinent labs & imaging results that were available during my care of the patient were reviewed by me and considered in my medical decision making (see chart for details).     Suspect likely tendinitis/tenosynovitis-placing in thumb spica for immobilization as well as continue on anti-inflammatories.  Provided Naprosyn.  Ice and elevation.  Monitor for gradual resolution.  Discussed strict return precautions. Patient verbalized understanding and is agreeable with plan.  Final Clinical Impressions(s) / UC Diagnoses   Final diagnoses:  De Quervain's tenosynovitis, right     Discharge Instructions     Wear wrist brace for the next 1 to 2 weeks to limit motion triggering pain and swelling May continue to ice Naprosyn twice daily for pain Follow-up if not improving or worsening    ED Prescriptions    Medication Sig Dispense Auth. Provider   naproxen (NAPROSYN) 500 MG tablet Take 1 tablet (500 mg total) by mouth 2 (two) times daily. 30 tablet Hasina Kreager, [place] C, PA-C     PDMP not reviewed this encounter.   Villa Rodriguez,  Lew Dawes 11/01/20 1453

## 2020-11-01 NOTE — ED Triage Notes (Signed)
Pt said no injury to her right hand just noticed some swelling iin her right hand, more in her right thumb area. No obvious deformity or swelling noted.

## 2021-12-19 ENCOUNTER — Telehealth: Payer: Self-pay

## 2021-12-19 ENCOUNTER — Ambulatory Visit: Payer: BC Managed Care – PPO | Admitting: Allergy

## 2021-12-19 ENCOUNTER — Other Ambulatory Visit: Payer: Self-pay

## 2021-12-19 ENCOUNTER — Encounter: Payer: Self-pay | Admitting: Allergy

## 2021-12-19 VITALS — BP 142/80 | HR 82 | Temp 98.0°F | Resp 16 | Ht 65.0 in | Wt 267.5 lb

## 2021-12-19 DIAGNOSIS — J329 Chronic sinusitis, unspecified: Secondary | ICD-10-CM | POA: Diagnosis not present

## 2021-12-19 DIAGNOSIS — J3089 Other allergic rhinitis: Secondary | ICD-10-CM | POA: Insufficient documentation

## 2021-12-19 MED ORDER — RYALTRIS 665-25 MCG/ACT NA SUSP: 1.0000 | Freq: Two times a day (BID) | NASAL | 5 refills | Status: DC

## 2021-12-19 NOTE — Patient Instructions (Addendum)
Today's skin testing showed: Positive to grass and mold.  Negative to common foods.  Results given.  Environmental allergies Start environmental control measures as below. I don't think the above allergens are causing your current symptoms.  Try Xyzal (levocetirizine) daily at night. May take twice a day during allergy flares. May switch antihistamines every few months. Start Ryaltris (olopatadine + mometasone nasal spray combination) 1-2 sprays per nostril twice a day. Sample given. If too expensive let us know. Nasal saline spray (i.e., Simply Saline) or nasal saline lavage (i.e., NeilMed) is recommended as needed and prior to medicated nasal sprays.  Recommend ENT evaluation next to look at sinus anatomy. Refer to ENT Dr. Suszanne Conners - for chronic nasal congestion.   Infections: Keep track of infections/antibiotics use.  Follow up in 2 months or sooner if needed.    Reducing Pollen Exposure Pollen seasons: trees (spring), grass (summer) and ragweed/weeds (fall). Keep windows closed in your home and car to lower pollen exposure.  Install air conditioning in the bedroom and throughout the house if possible.  Avoid going out in dry windy days - especially early morning. Pollen counts are highest between 5 - 10 AM and on dry, hot and windy days.  Save outside activities for late afternoon or after a heavy rain, when pollen levels are lower.  Avoid mowing of grass if you have grass pollen allergy. Be aware that pollen can also be transported indoors on people and pets.  Dry your clothes in an automatic dryer rather than hanging them outside where they might collect pollen.  Rinse hair and eyes before bedtime.  Mold Control Mold and fungi can grow on a variety of surfaces provided certain temperature and moisture conditions exist.  Outdoor molds grow on plants, decaying vegetation and soil. The major outdoor mold, Alternaria and Cladosporium, are found in very high numbers during hot and  dry conditions. Generally, a late summer - fall peak is seen for common outdoor fungal spores. Rain will temporarily lower outdoor mold spore count, but counts rise rapidly when the rainy period ends. The most important indoor molds are Aspergillus and Penicillium. Dark, humid and poorly ventilated basements are ideal sites for mold growth. The next most common sites of mold growth are the bathroom and the kitchen. Outdoor (Seasonal) Mold Control Use air conditioning and keep windows closed. Avoid exposure to decaying vegetation. Avoid leaf raking. Avoid grain handling. Consider wearing a face mask if working in moldy areas.  Indoor (Perennial) Mold Control  Maintain humidity below 50%. Get rid of mold growth on hard surfaces with water, detergent and, if necessary, 5% bleach (do not mix with other cleaners). Then dry the area completely. If mold covers an area more than 10 square feet, consider hiring an indoor environmental professional. For clothing, washing with soap and water is best. If moldy items cannot be cleaned and dried, throw them away. Remove sources e.g. contaminated carpets. Repair and seal leaking roofs or pipes. Using dehumidifiers in damp basements may be helpful, but empty the water and clean units regularly to prevent mildew from forming. All rooms, especially basements, bathrooms and kitchens, require ventilation and cleaning to deter mold and mildew growth. Avoid carpeting on concrete or damp floors, and storing items in damp areas.  Buffered Isotonic Saline Irrigations:  Goal: When you irrigate with the isotonic saline (salt water) it washes mucous and other debris from your nose that could be contributing to your nasal symptoms.   Recipe: Obtain 1 quart jar that is  clean Fill with clean (bottled, boiled or distilled) water Add 1-2 heaping teaspoons of salt without iodine If the solution with 2 teaspoons of salt is too strong, adjust the amount down until better  tolerated Add 1 teaspoon of [employer] baking soda (pure bicarbonate) Mix ingredients together and store at room temperature and discard after 1 week * Alternatively you can buy pre made salt packets for the NeilMed bottle or there          are other over the counter brands available  Instructions: Warm  cup of the solution in the microwave if desired but be careful not to overheat as this will burn the inside of your nose Stand over a sink (or do it while you shower) and squirt the solution into one side of your nose aiming towards the back of your head Sometimes saying coca cola while irrigating can be helpful to prevent fluid from going down your throat  The solution will travel to the back of your nose and then come out the other side Perform this again on the other side Try to do this twice a day If you are using a nasal spray in addition to the irrigation, irrigate first and then use the topical nasal spray otherwise you will wash the nasal spray out of your nose

## 2021-12-19 NOTE — Assessment & Plan Note (Addendum)
Perennial rhino conjunctivitis symptoms for 1-2 years. Tried OTC antihistamines, Flonase, Afrin with no benefit. No prior allergy/ENT evaluation.  Today's skin testing showed: Positive to grass and mold.   Start environmental control measures as below.  Discussed that the above allergens are most likely not causing her current symptoms.   Try Xyzal (levocetirizine) daily at night. May take twice a day during allergy flares. May switch antihistamines every few months.  Start Ryaltris (olopatadine + mometasone nasal spray combination) 1-2 sprays per nostril twice a day. Sample given.  If too expensive let us know.  Nasal saline spray (i.e., Simply Saline) or nasal saline lavage (i.e., NeilMed) is recommended as needed and prior to medicated nasal sprays.  Recommend ENT evaluation next to look at sinus anatomy.  Refer to ENT - for chronic nasal congestion.

## 2021-12-19 NOTE — Telephone Encounter (Signed)
Referral for ENT sent to the [facility]

## 2021-12-19 NOTE — Progress Notes (Signed)
New Patient Note  RE: Meredith Yoder MRN: [id number] DOB: [birth year] Date of Office Visit: 12/19/2021  Consult requested by: Merri Brunette, MD Primary care provider: Merri Brunette, MD  Chief Complaint: Allergies (2 Sinus infections since christmas. Can't pass a sneeze hard to sneeze and builds up to the point she gets headaches )  History of Present Illness: I had the pleasure of seeing Meredith Yoder for initial evaluation at the [facility] on 12/19/2021. She is a 36 y.o. female, who is referred here by Merri Brunette, MD for the evaluation of nasal congestion.  Rhinitis: She reports symptoms of nasal congestion, sinus pressure, dizziness, PND, itchy/watery eyes. Symptoms have been going on for 1-2 years. The symptoms are present all year around. Anosmia: no. Headache: yes. She has used Claritin, zyrtec-D, benadryl, Flonase, Afrin, with minimal improvement in symptoms. Sinus infections: 2 last year. Previous work up includes: in high school and not sure of results. No prior AIT. Previous ENT evaluation: no. Previous sinus imaging: no. History of nasal polyps: no. Last eye exam: more than 1 year ago. History of reflux: some symptoms and takes Nexium as needed.  Diagnosed with tonsillitis and was prescribed amoxicillin which she did not start yet.   Assessment and Plan: Meredith Yoder is a 36 y.o. female with: Other allergic rhinitis Perennial rhino conjunctivitis symptoms for 1-2 years. Tried OTC antihistamines, Flonase, Afrin with no benefit. No prior allergy/ENT evaluation. Today's skin testing showed: Positive to grass and mold.  Start environmental control measures as below. Discussed that the above allergens are most likely not causing her current symptoms.  Try Xyzal (levocetirizine) daily at night. May take twice a day during allergy flares. May switch antihistamines every few months. Start Ryaltris (olopatadine + mometasone nasal spray combination)  1-2 sprays per nostril twice a day. Sample given. If too expensive let us know. Nasal saline spray (i.e., Simply Saline) or nasal saline lavage (i.e., NeilMed) is recommended as needed and prior to medicated nasal sprays. Recommend ENT evaluation next to look at sinus anatomy. Refer to ENT - for chronic nasal congestion.  Frequent episodes of sinusitis 2 sinus infections the past year.  Patient works as a Architectural technologist in first grade.   Keep track of infections/antibiotics use. Consider getting basic immune evaluation bloodwork next if worsening.  Return in about 2 months (around 02/16/2022).  Meds ordered this encounter  Medications   Olopatadine-Mometasone (RYALTRIS) 665-25 MCG/ACT SUSP    Sig: Place 1-2 sprays into the nose in the morning and at bedtime.    Dispense:  29 g    Refill:  5    [phone]   Lab Orders  No laboratory test(s) ordered today    Other allergy screening: Asthma: no  Food allergy: no Medication allergy: no Hymenoptera allergy: no Urticaria: no Eczema:no History of recurrent infections suggestive of immunodeficency: no  Diagnostics: Skin Testing: Environmental allergy panel and select foods. Positive to grass and mold. Negative to common foods.  Results discussed with patient/family.  Airborne Adult Perc - 12/19/21 1120     Time Antigen Placed 1105    Allergen Manufacturer Greer    Location Back    Number of Test 59    Panel 1 Select    1. Control-Buffer 50% Glycerol Negative    2. Control-Histamine 1 mg/ml 3+    3. Albumin saline Negative    4. Bahia Negative    5. French Southern Territories Negative    6. Johnson Negative    7.  Kentucky Blue 2+    8. Meadow Fescue Negative    9. Perennial Rye Negative    10. Sweet Vernal 2+    11. Timothy 2+    12. Cocklebur Negative    13. Burweed Marshelder Negative    14. Ragweed, short Negative    15. Ragweed, Giant Negative    16. Plantain,  English Negative    17. Lamb's Quarters Negative    18. Sheep  Sorrell Negative    19. Rough Pigweed Negative    20. Marsh Elder, Rough Negative    21. Mugwort, Common Negative    22. Ash mix Negative    23. Birch mix Negative    24. Beech American Negative    25. Box, Elder Negative    26. Cedar, red Negative    27. Cottonwood, Guinea-BissauEastern Negative    28. Elm mix Negative    29. Hickory Negative    30. Maple mix Negative    31. Oak, Guinea-BissauEastern mix Negative    32. Pecan Pollen Negative    33. Pine mix Negative    34. Sycamore Eastern Negative    35. Walnut, Black Pollen Negative    36. Alternaria alternata Negative    37. Cladosporium Herbarum Negative    38. Aspergillus mix Negative    39. Penicillium mix Negative    40. Bipolaris sorokiniana (Helminthosporium) Negative    41. Drechslera spicifera (Curvularia) Negative    42. Mucor plumbeus Negative    43. Fusarium moniliforme Negative    44. Aureobasidium pullulans (pullulara) Negative    45. Rhizopus oryzae Negative    46. Botrytis cinera Negative    47. Epicoccum nigrum Negative    48. Phoma betae Negative    49. Candida Albicans Negative    50. Trichophyton mentagrophytes Negative    51. Mite, D Farinae  5,000 AU/ml Negative    52. Mite, D Pteronyssinus  5,000 AU/ml Negative    53. Cat Hair 10,000 BAU/ml Negative    54.  Dog Epithelia Negative    55. Mixed Feathers Negative    56. Horse Epithelia Negative    57. Cockroach, German Negative    58. Mouse Negative    59. Tobacco Leaf Negative    Comments n             Food Perc - 12/19/21 1344       Test Information   Time Antigen Placed 1100    Allergen Manufacturer Waynette ButteryGreer    Location Back    Number of allergen test 10      Food   1. Peanut Negative    2. Soybean food Negative    3. Wheat, whole Negative    4. Sesame Negative    5. Milk, cow Negative    6. Egg White, chicken Negative    7. Casein Negative    8. Shellfish mix Negative    9. Fish mix Negative    10. Cashew Negative             Intradermal -  12/19/21 1124     Time Antigen Placed 1125    Allergen Manufacturer Waynette ButteryGreer    Location Arm    Number of Test 14    Control Negative    French Southern TerritoriesBermuda Negative    Johnson Negative    Ragweed mix Negative    Weed mix Negative    Tree mix Negative    Mold 1 Negative    Mold 2 2+  Mold 3 Negative    Mold 4 Negative    Cat Negative    Dog Negative    Cockroach Negative    Mite mix Negative             Past Medical History: Patient Active Problem List   Diagnosis Date Noted   Frequent episodes of sinusitis 12/19/2021   Other allergic rhinitis 12/19/2021   Chest pain 12/21/2016   HTN (hypertension) 12/21/2016   Tachycardia 12/21/2016   Abnormal thyroid exam 12/21/2016   Past Medical History:  Diagnosis Date   Hypertension    Murmur    Past Surgical History: Past Surgical History:  Procedure Laterality Date   DENTAL SURGERY     Medication List:  Current Outpatient Medications  Medication Sig Dispense Refill   amLODipine (NORVASC) 5 MG tablet Take 5 mg by mouth daily.     naproxen (NAPROSYN) 500 MG tablet Take 1 tablet (500 mg total) by mouth 2 (two) times daily. 30 tablet 0   Olopatadine-Mometasone (RYALTRIS) 665-25 MCG/ACT SUSP Place 1-2 sprays into the nose in the morning and at bedtime. 29 g 5   No current facility-administered medications for this visit.   Allergies: Allergies  Allergen Reactions   Escitalopram Oxalate Other (See Comments)   Hydrochlorothiazide Other (See Comments)   Sertraline Hcl Other (See Comments)   Social History: Social History   Socioeconomic History   Marital status: Single    Spouse name: Not on file   Number of children: Not on file   Years of education: Not on file   Highest education level: Not on file  Occupational History   Not on file  Tobacco Use   Smoking status: Never   Smokeless tobacco: Never  Vaping Use   Vaping Use: Never used  Substance and Sexual Activity   Alcohol use: No    Comment: occasional   Drug  use: No   Sexual activity: Yes    Birth control/protection: None  Other Topics Concern   Not on file  Social History Narrative   Not on file   Social Determinants of Health   Financial Resource Strain: Not on file  Food Insecurity: Not on file  Transportation Needs: Not on file  Physical Activity: Not on file  Stress: Not on file  Social Connections: Not on file   Lives in a 34 year old house. 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  HENT:  Positive for congestion, postnasal drip, sinus pressure and sinus pain. Negative for rhinorrhea.   Eyes:  Negative for itching.  Respiratory:  Negative for cough, chest tightness, shortness of breath and wheezing.   Cardiovascular:  Negative for chest pain.  Gastrointestinal:  Positive for constipation. Negative for abdominal pain.  Genitourinary:  Negative for difficulty urinating.  Skin:  Negative for rash.  Allergic/Immunologic: Positive for environmental allergies. Negative for food allergies.  Neurological:  Positive for headaches.   Objective: BP (!) 142/80 (BP  Location: Left Arm, Patient Position: Sitting, Cuff Size: Large)    Pulse 82    Temp 98 F (36.7 C) (Temporal)    Resp 16    Ht 5\' 5"  (1.651 m)    Wt 267 lb 8 oz (121.3 kg)    SpO2 98%    BMI 44.51 kg/m  Body mass index is 44.51 kg/m. Physical Exam Vitals and nursing note reviewed.  Constitutional:      Appearance: Normal appearance. She is well-developed.  HENT:     Head:  Normocephalic and atraumatic.     Right Ear: Tympanic membrane and external ear normal.     Left Ear: Tympanic membrane and external ear normal.     Nose: Nose normal.     Mouth/Throat:     Mouth: Mucous membranes are moist.     Comments: Few white spots on tonsils Eyes:     Conjunctiva/sclera: Conjunctivae normal.  Cardiovascular:     Rate and Rhythm: Normal rate and regular rhythm.     Heart sounds: Normal heart sounds. No murmur heard.   No friction rub. No gallop.  Pulmonary:     Effort: Pulmonary effort is normal.     Breath sounds: Normal breath sounds. No wheezing, rhonchi or rales.  Musculoskeletal:     Cervical back: Neck supple.  Skin:    General: Skin is warm.     Findings: No rash.  Neurological:     Mental Status: She is alert and oriented to person, place, and time.  Psychiatric:        Behavior: Behavior normal.  The plan was reviewed with the patient/family, and all questions/concerned were addressed.  It was my pleasure to see Meredith Yoder today and participate in her care. Please feel free to contact me with any questions or concerns.  Sincerely,  Claris Gower, DO Allergy & Immunology  [facility] office: [phone] [facility] office: [phone]

## 2021-12-19 NOTE — Assessment & Plan Note (Signed)
2 sinus infections the past year.  Patient works as a Architectural technologist in first grade.    Keep track of infections/antibiotics use.  Consider getting basic immune evaluation bloodwork next if worsening.

## 2021-12-24 NOTE — Telephone Encounter (Signed)
Please see the following.   Thanks  

## 2021-12-24 NOTE — Telephone Encounter (Signed)
Referral has been placed to Dr. Cathleen Fears office, notes and demographics have been faxed, [phone]   Called and lvm per DPR advising patient of referral being placed. Provided patient with address and phone number to Dr. Deeann Saint office. Advised patient to call us back with any questions or concerns.   [address], [place], [place] [address] [phone]

## 2022-02-17 DIAGNOSIS — H1010 Acute atopic conjunctivitis, unspecified eye: Secondary | ICD-10-CM | POA: Insufficient documentation

## 2022-02-17 NOTE — Progress Notes (Deleted)
? ?Follow Up Note ? ?RE: Meredith Yoder MRN: [id number] DOB: 1986-03-14 ?Date of Office Visit: 02/18/2022 ? ?Referring provider: Merri Brunette, MD ?Primary care provider: Merri Brunette, MD ? ?Chief Complaint: No chief complaint on file. ? ?History of Present Illness: ?I had the pleasure of seeing Meredith Yoder for a follow up visit at the [facility] on 02/17/2022. She is a 36 y.o. female, who is being followed for allergic rhinoconjunctivitis and frequent sinusitis. Her previous allergy office visit was on 12/19/2021 with Dr. Selena Batten. Today is a regular follow up visit. ? ?Other allergic rhinitis ?Perennial rhino conjunctivitis symptoms for 1-2 years. Tried OTC antihistamines, Flonase, Afrin with no benefit. No prior allergy/ENT evaluation. ?Today's skin testing showed: Positive to grass and mold.  ?Start environmental control measures as below. ?Discussed that the above allergens are most likely not causing her current symptoms.  ?Try Xyzal (levocetirizine) daily at night. May take twice a day during allergy flares. May switch antihistamines every few months. ?Start Ryaltris (olopatadine + mometasone nasal spray combination) 1-2 sprays per nostril twice a day. Sample given. ?If too expensive let us know. ?Nasal saline spray (i.e., Simply Saline) or nasal saline lavage (i.e., NeilMed) is recommended as needed and prior to medicated nasal sprays. ?Recommend ENT evaluation next to look at sinus anatomy. ?Refer to ENT - for chronic nasal congestion. ?  ?Frequent episodes of sinusitis ?2 sinus infections the past year.  Patient works as a Architectural technologist in first grade.   ?Keep track of infections/antibiotics use. ?Consider getting basic immune evaluation bloodwork next if worsening. ?  ?Return in about 2 months (around 02/16/2022). ? ?Assessment and Plan: ?Meredith Yoder is a 36 y.o. female with: ?No problem-specific Assessment & Plan notes found for this encounter. ? ?No follow-ups on  file. ? ?No orders of the defined types were placed in this encounter. ? ?Lab Orders  ?No laboratory test(s) ordered today  ? ? ?Diagnostics: ?Spirometry:  ?Tracings reviewed. Her effort: {Blank single:19197::"Good reproducible efforts.","It was hard to get consistent efforts and there is a question as to whether this reflects a maximal maneuver.","Poor effort, data can not be interpreted."} ?FVC: ***L ?FEV1: ***L, ***% predicted ?FEV1/FVC ratio: ***% ?Interpretation: {Blank single:19197::"Spirometry consistent with mild obstructive disease","Spirometry consistent with moderate obstructive disease","Spirometry consistent with severe obstructive disease","Spirometry consistent with possible restrictive disease","Spirometry consistent with mixed obstructive and restrictive disease","Spirometry uninterpretable due to technique","Spirometry consistent with normal pattern","No overt abnormalities noted given today's efforts"}.  ?Please see scanned spirometry results for details. ? ?Skin Testing: {Blank single:19197::"Select foods","Environmental allergy panel","Environmental allergy panel and select foods","Food allergy panel","None","Deferred due to recent antihistamines use"}. ?*** ?Results discussed with patient/family. ? ? ?Medication List:  ?Current Outpatient Medications  ?Medication Sig Dispense Refill  ? amLODipine (NORVASC) 5 MG tablet Take 5 mg by mouth daily.    ? naproxen (NAPROSYN) 500 MG tablet Take 1 tablet (500 mg total) by mouth 2 (two) times daily. 30 tablet 0  ? Olopatadine-Mometasone (RYALTRIS) X543819 MCG/ACT SUSP Place 1-2 sprays into the nose in the morning and at bedtime. 29 g 5  ? ?No current facility-administered medications for this visit.  ? ?Allergies: ?Allergies  ?Allergen Reactions  ? Escitalopram Oxalate Other (See Comments)  ? Hydrochlorothiazide Other (See Comments)  ? Sertraline Hcl Other (See Comments)  ? ?I reviewed her past medical history, social history, family history, and  environmental history and no significant changes have been reported from her previous visit. ? ?Review of Systems  ?Constitutional:  Negative for appetite  change, chills, fever and unexpected weight change.  ?HENT:  Positive for congestion, postnasal drip, sinus pressure and sinus pain. Negative for rhinorrhea.   ?Eyes:  Negative for itching.  ?Respiratory:  Negative for cough, chest tightness, shortness of breath and wheezing.   ?Cardiovascular:  Negative for chest pain.  ?Gastrointestinal:  Positive for constipation. Negative for abdominal pain.  ?Genitourinary:  Negative for difficulty urinating.  ?Skin:  Negative for rash.  ?Allergic/Immunologic: Positive for environmental allergies. Negative for food allergies.  ?Neurological:  Positive for headaches.  ? ?Objective: ?There were no vitals taken for this visit. ?There is no height or weight on file to calculate BMI. ?Physical Exam ?Vitals and nursing note reviewed.  ?Constitutional:   ?   Appearance: Normal appearance. She is well-developed.  ?HENT:  ?   Head: Normocephalic and atraumatic.  ?   Right Ear: Tympanic membrane and external ear normal.  ?   Left Ear: Tympanic membrane and external ear normal.  ?   Nose: Nose normal.  ?   Mouth/Throat:  ?   Mouth: Mucous membranes are moist.  ?   Comments: Few white spots on tonsils ?Eyes:  ?   Conjunctiva/sclera: Conjunctivae normal.  ?Cardiovascular:  ?   Rate and Rhythm: Normal rate and regular rhythm.  ?   Heart sounds: Normal heart sounds. No murmur heard. ?  No friction rub. No gallop.  ?Pulmonary:  ?   Effort: Pulmonary effort is normal.  ?   Breath sounds: Normal breath sounds. No wheezing, rhonchi or rales.  ?Musculoskeletal:  ?   Cervical back: Neck supple.  ?Skin: ?   General: Skin is warm.  ?   Findings: No rash.  ?Neurological:  ?   Mental Status: She is alert and oriented to person, place, and time.  ?Psychiatric:     ?   Behavior: Behavior normal.  ? ?Previous notes and tests were reviewed. ?The plan was  reviewed with the patient/family, and all questions/concerned were addressed. ? ?It was my pleasure to see Meredith Yoder today and participate in her care. Please feel free to contact me with any questions or concerns. ? ?Sincerely, ? ?Wyline Mood, DO ?Allergy & Immunology ? ?[facility] ?[place] office: [phone] ?[place] office: [phone] ?

## 2022-02-18 ENCOUNTER — Ambulatory Visit: Payer: BC Managed Care – PPO | Admitting: Allergy

## 2022-05-28 NOTE — Progress Notes (Deleted)
Follow Up Note  RE: Meredith Yoder MRN: [id number] DOB: Mar 24, 1986 Date of Office Visit: 05/29/2022  Referring provider: Merri Brunette, MD Primary care provider: Merri Brunette, MD  Chief Complaint: No chief complaint on file.  History of Present Illness: I had the pleasure of seeing Meredith Yoder for a [facility]  on 05/28/2022. She is a 36 y.o. female, who is being followed for allergic rhinoconjunctivitis and frequent sinusitis. Her previous allergy office visit was on 12/19/2021 with Dr. Selena Batten. Today is a regular follow up visit.  Did patient go see ENT?  Other allergic rhinitis Perennial rhino conjunctivitis symptoms for 1-2 years. Tried OTC antihistamines, Flonase, Afrin with no benefit. No prior allergy/ENT evaluation. Today's skin testing showed: Positive to grass and mold.  Start environmental control measures as below. Discussed that the above allergens are most likely not causing her current symptoms.  Try Xyzal (levocetirizine) daily at night. May take twice a day during allergy flares. May switch antihistamines every few months. Start Ryaltris (olopatadine + mometasone nasal spray combination) 1-2 sprays per nostril twice a day. Sample given. If too expensive let us know. Nasal saline spray (i.e., Simply Saline) or nasal saline lavage (i.e., NeilMed) is recommended as needed and prior to medicated nasal sprays. Recommend ENT evaluation next to look at sinus anatomy. Refer to ENT - for chronic nasal congestion.   Frequent episodes of sinusitis 2 sinus infections the past year.  Patient works as a Architectural technologist in first grade.   Keep track of infections/antibiotics use. Consider getting basic immune evaluation bloodwork next if worsening.   Return in about 2 months (around 02/16/2022).  Assessment and Plan: Meredith Yoder is a 36 y.o. female with: No problem-specific Assessment & Plan notes found for this  encounter.  No follow-ups on file.  No orders of the defined types were placed in this encounter.  Lab Orders  No laboratory test(s) ordered today    Diagnostics: Spirometry:  Tracings reviewed. Her effort: {Blank single:19197::"Good reproducible efforts.","It was hard to get consistent efforts and there is a question as to whether this reflects a maximal maneuver.","Poor effort, data can not be interpreted."} FVC: ***L FEV1: ***L, ***% predicted FEV1/FVC ratio: ***% Interpretation: {Blank single:19197::"Spirometry consistent with mild obstructive disease","Spirometry consistent with moderate obstructive disease","Spirometry consistent with severe obstructive disease","Spirometry consistent with possible restrictive disease","Spirometry consistent with mixed obstructive and restrictive disease","Spirometry uninterpretable due to technique","Spirometry consistent with normal pattern","No overt abnormalities noted given today's efforts"}.  Please see scanned spirometry results for details.  Skin Testing: {Blank single:19197::"Select foods","Environmental allergy panel","Environmental allergy panel and select foods","Food allergy panel","None","Deferred due to recent antihistamines use"}. *** Results discussed with patient/family.   Medication List:  Current Outpatient Medications  Medication Sig Dispense Refill   amLODipine (NORVASC) 5 MG tablet Take 5 mg by mouth daily.     naproxen (NAPROSYN) 500 MG tablet Take 1 tablet (500 mg total) by mouth 2 (two) times daily. 30 tablet 0   Olopatadine-Mometasone (RYALTRIS) 665-25 MCG/ACT SUSP Place 1-2 sprays into the nose in the morning and at bedtime. 29 g 5   No current facility-administered medications for this visit.   Allergies: Allergies  Allergen Reactions   Escitalopram Oxalate Other (See Comments)   Hydrochlorothiazide Other (See Comments)   Sertraline Hcl Other (See Comments)   I reviewed her past medical history, social  history, family history, and environmental history and no significant changes have been reported from her previous visit.  Review of Systems  Constitutional:  Negative for appetite change, chills, fever and unexpected weight change.  HENT:  Positive for congestion, postnasal drip, sinus pressure and sinus pain. Negative for rhinorrhea.   Eyes:  Negative for itching.  Respiratory:  Negative for cough, chest tightness, shortness of breath and wheezing.   Cardiovascular:  Negative for chest pain.  Gastrointestinal:  Positive for constipation. Negative for abdominal pain.  Genitourinary:  Negative for difficulty urinating.  Skin:  Negative for rash.  Allergic/Immunologic: Positive for environmental allergies. Negative for food allergies.  Neurological:  Positive for headaches.    Objective: There were no vitals taken for this visit. There is no height or weight on file to calculate BMI. Physical Exam Vitals and nursing note reviewed.  Constitutional:      Appearance: Normal appearance. She is well-developed.  HENT:     Head: Normocephalic and atraumatic.     Right Ear: Tympanic membrane and external ear normal.     Left Ear: Tympanic membrane and external ear normal.     Nose: Nose normal.     Mouth/Throat:     Mouth: Mucous membranes are moist.     Comments: Few white spots on tonsils Eyes:     Conjunctiva/sclera: Conjunctivae normal.  Cardiovascular:     Rate and Rhythm: Normal rate and regular rhythm.     Heart sounds: Normal heart sounds. No murmur heard.    No friction rub. No gallop.  Pulmonary:     Effort: Pulmonary effort is normal.     Breath sounds: Normal breath sounds. No wheezing, rhonchi or rales.  Musculoskeletal:     Cervical back: Neck supple.  Skin:    General: Skin is warm.     Findings: No rash.  Neurological:     Mental Status: She is alert and oriented to person, place, and time.  Psychiatric:        Behavior: Behavior normal.    Previous notes and  tests were reviewed. The plan was reviewed with the patient/family, and all questions/concerned were addressed.  It was my pleasure to see Meredith Yoder today and participate in her care. Please feel free to contact me with any questions or concerns.  Sincerely,  Wyline Mood, DO Allergy & Immunology  [facility] office: [phone] [facility] office: [phone]

## 2022-05-29 ENCOUNTER — Ambulatory Visit: Payer: BC Managed Care – PPO | Admitting: Allergy

## 2022-05-29 DIAGNOSIS — J3089 Other allergic rhinitis: Secondary | ICD-10-CM

## 2022-05-29 DIAGNOSIS — J329 Chronic sinusitis, unspecified: Secondary | ICD-10-CM

## 2022-11-20 ENCOUNTER — Encounter: Payer: Self-pay | Admitting: Allergy

## 2022-11-20 ENCOUNTER — Telehealth: Payer: Self-pay

## 2022-11-20 ENCOUNTER — Ambulatory Visit: Payer: BC Managed Care – PPO | Admitting: Allergy

## 2022-11-20 VITALS — BP 128/76 | HR 98 | Temp 98.5°F | Resp 18 | Ht 65.5 in | Wt 267.5 lb

## 2022-11-20 DIAGNOSIS — J329 Chronic sinusitis, unspecified: Secondary | ICD-10-CM

## 2022-11-20 DIAGNOSIS — J3089 Other allergic rhinitis: Secondary | ICD-10-CM

## 2022-11-20 DIAGNOSIS — B999 Unspecified infectious disease: Secondary | ICD-10-CM | POA: Insufficient documentation

## 2022-11-20 MED ORDER — FLUTICASONE PROPIONATE 50 MCG/ACT NA SUSP: 1.0000 | Freq: Two times a day (BID) | NASAL | 3 refills | Status: DC | PRN

## 2022-11-20 NOTE — Patient Instructions (Addendum)
Environmental allergies 2023 skin testing showed: Positive to grass and mold.  Continue environmental control measures as below. Use over the counter antihistamines such as Zyrtec (cetirizine), Claritin (loratadine), Allegra (fexofenadine), or Xyzal (levocetirizine) daily as needed. May take twice a day during allergy flares. May switch antihistamines every few months. Use Flonase (fluticasone) nasal spray 1 spray per nostril twice a day as needed for nasal congestion.  May use over the counter Advil sinus decongestant for a few days at a time only.   Use Atrovent (ipratropium) 0.03% 1-2 sprays per nostril twice a day as needed for runny nose/drainage.  Nasal saline spray (i.e., Simply Saline) or nasal saline lavage (i.e., NeilMed) is recommended as needed and prior to medicated nasal sprays. Recommend ENT evaluation next to look at sinus anatomy. [facility] - chronic sinusitis.   Infections: Keep track of infections/antibiotics use.  Follow up in 6 months or sooner if needed.    Buffered Isotonic Saline Irrigations:  Goal: When you irrigate with the isotonic saline (salt water) it washes mucous and other debris from your nose that could be contributing to your nasal symptoms.   Recipe: Obtain 1 quart jar that is clean Fill with clean (bottled, boiled or distilled) water Add 1-2 heaping teaspoons of salt without iodine If the solution with 2 teaspoons of salt is too strong, adjust the amount down until better tolerated Add 1 teaspoon of [employer] baking soda (pure bicarbonate) Mix ingredients together and store at room temperature and discard after 1 week * Alternatively you can buy pre made salt packets for the NeilMed bottle or there          are other over the counter brands available  Instructions: Warm  cup of the solution in the microwave if desired but be careful not to overheat as this will burn the inside of your nose Stand over a sink (or do it while you shower)  and squirt the solution into one side of your nose aiming towards the back of your head Sometimes saying "coca cola" while irrigating can be helpful to prevent fluid from going down your throat  The solution will travel to the back of your nose and then come out the other side Perform this again on the other side Try to do this twice a day If you are using a nasal spray in addition to the irrigation, irrigate first and then use the topical nasal spray otherwise you will wash the nasal spray out of your nose  Reducing Pollen Exposure Pollen seasons: trees (spring), grass (summer) and ragweed/weeds (fall). Keep windows closed in your home and car to lower pollen exposure.  Install air conditioning in the bedroom and throughout the house if possible.  Avoid going out in dry windy days - especially early morning. Pollen counts are highest between 5 - 10 AM and on dry, hot and windy days.  Save outside activities for late afternoon or after a heavy rain, when pollen levels are lower.  Avoid mowing of grass if you have grass pollen allergy. Be aware that pollen can also be transported indoors on people and pets.  Dry your clothes in an automatic dryer rather than hanging them outside where they might collect pollen.  Rinse hair and eyes before bedtime.  Mold Control Mold and fungi can grow on a variety of surfaces provided certain temperature and moisture conditions exist.  Outdoor molds grow on plants, decaying vegetation and soil. The major outdoor mold, Alternaria and Cladosporium, are found  in very high numbers during hot and dry conditions. Generally, a late summer - fall peak is seen for common outdoor fungal spores. Rain will temporarily lower outdoor mold spore count, but counts rise rapidly when the rainy period ends. The most important indoor molds are Aspergillus and Penicillium. Dark, humid and poorly ventilated basements are ideal sites for mold growth. The next most common sites of mold  growth are the bathroom and the kitchen. Outdoor (Seasonal) Mold Control Use air conditioning and keep windows closed. Avoid exposure to decaying vegetation. Avoid leaf raking. Avoid grain handling. Consider wearing a face mask if working in moldy areas.  Indoor (Perennial) Mold Control  Maintain humidity below 50%. Get rid of mold growth on hard surfaces with water, detergent and, if necessary, 5% bleach (do not mix with other cleaners). Then dry the area completely. If mold covers an area more than 10 square feet, consider hiring an indoor environmental professional. For clothing, washing with soap and water is best. If moldy items cannot be cleaned and dried, throw them away. Remove sources e.g. contaminated carpets. Repair and seal leaking roofs or pipes. Using dehumidifiers in damp basements may be helpful, but empty the water and clean units regularly to prevent mildew from forming. All rooms, especially basements, bathrooms and kitchens, require ventilation and cleaning to deter mold and mildew growth. Avoid carpeting on concrete or damp floors, and storing items in damp areas.

## 2022-11-20 NOTE — Telephone Encounter (Signed)
Per Dr.Kim Referral to [facility] ENT: Chronic Sinusitis

## 2022-11-20 NOTE — Assessment & Plan Note (Signed)
Past history - Perennial rhino conjunctivitis symptoms for 1-2 years. Tried OTC antihistamines, Flonase, Afrin with no benefit. No prior ENT evaluation. 2023 skin testing showed: Positive to grass and mold.  Interim history - was doing well up until last month and now on 2nd course of antibiotics but still having significant sinus pressure. Did not see ENT yet.  Continue environmental control measures as below. Use over the counter antihistamines such as Zyrtec (cetirizine), Claritin (loratadine), Allegra (fexofenadine), or Xyzal (levocetirizine) daily as needed. May take twice a day during allergy flares. May switch antihistamines every few months. Use Flonase (fluticasone) nasal spray 1 spray per nostril twice a day as needed for nasal congestion.  May use over the counter Advil sinus decongestant for a few days at a time only.  Use Atrovent (ipratropium) 0.03% 1-2 sprays per nostril twice a day as needed for runny nose/drainage. Nasal saline spray (i.e., Simply Saline) or nasal saline lavage (i.e., NeilMed) is recommended as needed and prior to medicated nasal sprays. Recommend ENT evaluation next to look at sinus anatomy. [facility] - chronic sinusitis.  Finish antibiotics.

## 2022-11-20 NOTE — Assessment & Plan Note (Signed)
Past history - 2 sinus infections the past year.  Patient works as a Architectural technologist in first grade.   Interim history - 1 infection requiring amoxicillin and doxy.  Keep track of infections/antibiotics use. Consider getting basic immune evaluation bloodwork next if worsening.

## 2022-11-20 NOTE — Progress Notes (Signed)
Follow Up Note  RE: Meredith Yoder MRN: [id number] DOB: 1986/09/14 Date of Office Visit: 11/20/2022  Referring provider: Carol Ada, MD Primary care provider: Carol Ada, MD  Chief Complaint: Frequent Infections (Has a current infection going on . Went to urgent care on 12/9 and they gave her amox she finished and did not improve. She went to her pcp this past Monday. She is currently on doxycycline and atrovent. Feels pressure around the face and is having a hard time sleeping )  History of Present Illness: I had the pleasure of seeing Meredith Yoder for a follow up visit at [facility] [place] on 11/20/2022. She is a 36 y.o. 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 Her previous allergy office visit was on 12/19/2021 with Dr. Maudie Mercury. Today is a regular follow up visit.  Allergic rhinitis Patient went to [facility] for a sinus infection. She was treated with amoxicillin and prednisone which did not help. She is now on doxycycline and Atrovent which is helping but she is still having sinus pressure. No drainage.  Not taking any antihistamines.  Was not seen by ENT as she had to cancel her appointment due to a family medical emergency.   Frequent episodes of sinusitis This is the first sinus infection she had since the last visit.   Assessment and Plan: Meredith Yoder is a 36 y.o. female with: Other allergic rhinitis Past history - Perennial rhino conjunctivitis symptoms for 1-2 years. Tried OTC antihistamines, Flonase, Afrin with no benefit. No prior ENT evaluation. 2023 skin testing showed: Positive to grass and mold.  Interim history - was doing well up until last month and now on 2nd course of antibiotics but still having significant sinus pressure. Did not see ENT yet.  Continue environmental control measures as below. Use over the counter antihistamines such as Zyrtec (cetirizine), Claritin (loratadine), Allegra  (fexofenadine), or Xyzal (levocetirizine) daily as needed. May take twice a day during allergy flares. May switch antihistamines every few months. Use Flonase (fluticasone) nasal spray 1 spray per nostril twice a day as needed for nasal congestion.  May use over the counter Advil sinus decongestant for a few days at a time only.  Use Atrovent (ipratropium) 0.03% 1-2 sprays per nostril twice a day as needed for runny nose/drainage. Nasal saline spray (i.e., Simply Saline) or nasal saline lavage (i.e., NeilMed) is recommended as needed and prior to medicated nasal sprays. Recommend ENT evaluation next to look at sinus anatomy. [facility] ENT - chronic sinusitis.  Finish antibiotics.   Frequent episodes of sinusitis Past history - 2 sinus infections the past year.  Patient works as a Optometrist in first grade.   Interim history - 1 infection requiring amoxicillin and doxy.  Keep track of infections/antibiotics use. Consider getting basic immune evaluation bloodwork next if worsening.  Return in about 6 months (around 05/22/2023).  Meds ordered this encounter  Medications   fluticasone (FLONASE) 50 MCG/ACT nasal spray    Sig: Place 1 spray into both nostrils 2 (two) times daily as needed (nasal congestion).    Dispense:  16 g    Refill:  3   Lab Orders  No laboratory test(s) ordered today    Diagnostics: None.   Medication List:  Current Outpatient Medications  Medication Sig Dispense Refill   amLODipine (NORVASC) 5 MG tablet Take 10 mg by mouth daily.     doxycycline (VIBRA-TABS) 100 MG tablet Take 100 mg by mouth 2 (  two) times daily.     ELDERBERRY PO Take by mouth.     fluticasone (FLONASE) 50 MCG/ACT nasal spray Place 1 spray into both nostrils 2 (two) times daily as needed (nasal congestion). 16 g 3   ipratropium (ATROVENT) 0.03 % nasal spray Place 2 sprays into both nostrils 2 (two) times daily.     Multiple Vitamin (MULTIVITAMIN ADULT PO) Take by mouth.     naproxen  (NAPROSYN) 500 MG tablet Take 1 tablet (500 mg total) by mouth 2 (two) times daily. 30 tablet 0   No current facility-administered medications for this visit.   Allergies: Allergies  Allergen Reactions   Escitalopram Oxalate Other (See Comments)   Hydrochlorothiazide Other (See Comments)   Sertraline Hcl Other (See Comments)   I reviewed her past medical history, social history, family history, and environmental history and no significant changes have been reported from her previous visit.  Review of Systems  Constitutional:  Negative for appetite change, chills, fever and unexpected weight change.  HENT:  Positive for congestion, sinus pressure and sinus pain. Negative for postnasal drip and rhinorrhea.   Eyes:  Negative for itching.  Respiratory:  Negative for cough, chest tightness, shortness of breath and wheezing.   Cardiovascular:  Negative for chest pain.  Gastrointestinal:  Negative for abdominal pain.  Genitourinary:  Negative for difficulty urinating.  Skin:  Negative for rash.  Allergic/Immunologic: Positive for environmental allergies. Negative for food allergies.  Neurological:  Positive for headaches.    Objective: BP 128/76   Pulse 98   Temp 98.5 F (36.9 C)   Resp 18   Ht 5' 5.5" (1.664 m)   Wt 267 lb 8 oz (121.3 kg)   SpO2 98%   BMI 43.84 kg/m  Body mass index is 43.84 kg/m. Physical Exam Vitals and nursing note reviewed.  Constitutional:      Appearance: Normal appearance. She is well-developed.  HENT:     Head: Normocephalic and atraumatic.     Right Ear: Tympanic membrane and external ear normal.     Left Ear: Tympanic membrane and external ear normal.     Nose: Congestion present.     Mouth/Throat:     Mouth: Mucous membranes are moist.     Comments: Few white spots on tonsils Eyes:     Conjunctiva/sclera: Conjunctivae normal.  Cardiovascular:     Rate and Rhythm: Normal rate and regular rhythm.     Heart sounds: Normal heart sounds. No  murmur heard.    No friction rub. No gallop.  Pulmonary:     Effort: Pulmonary effort is normal.     Breath sounds: Normal breath sounds. No wheezing, rhonchi or rales.  Musculoskeletal:     Cervical back: Neck supple.  Skin:    General: Skin is warm.     Findings: No rash.  Neurological:     Mental Status: She is alert and oriented to person, place, and time.  Psychiatric:        Behavior: Behavior normal.    Previous notes and tests were reviewed. The plan was reviewed with the patient/family, and all questions/concerned were addressed.  It was my pleasure to see Meredith Yoder today and participate in her care. Please feel free to contact me with any questions or concerns.  Sincerely,  Wyline Mood, DO Allergy & Immunology  [facility] office: [phone] [facility] office: [phone]

## 2022-12-03 NOTE — Telephone Encounter (Signed)
Referral has been faxed to [facility]. I left a detailed voicemail for the patient and will mail a letter to her also.   [facility] [place] [facility] [place] [facility] [address], [place] [address] Phone: [phone] Fax: [phone]

## 2023-03-22 ENCOUNTER — Other Ambulatory Visit: Payer: Self-pay

## 2023-03-22 ENCOUNTER — Emergency Department (HOSPITAL_BASED_OUTPATIENT_CLINIC_OR_DEPARTMENT_OTHER)
Admission: EM | Admit: 2023-03-22 | Discharge: 2023-03-22 | Disposition: A | Discharge status: Home / Self Care | Payer: BC Managed Care – PPO | Attending: Emergency Medicine | Admitting: Emergency Medicine

## 2023-03-22 ENCOUNTER — Encounter (HOSPITAL_BASED_OUTPATIENT_CLINIC_OR_DEPARTMENT_OTHER): Payer: Self-pay | Admitting: Emergency Medicine

## 2023-03-22 DIAGNOSIS — R12 Heartburn: Secondary | ICD-10-CM

## 2023-03-22 DIAGNOSIS — R718 Other abnormality of red blood cells: Secondary | ICD-10-CM | POA: Diagnosis not present

## 2023-03-22 LAB — URINALYSIS, ROUTINE W REFLEX MICROSCOPIC
Bilirubin Urine: NEGATIVE
Glucose, UA: NEGATIVE mg/dL
Hgb urine dipstick: NEGATIVE
Ketones, ur: NEGATIVE mg/dL
Leukocytes,Ua: NEGATIVE
Nitrite: NEGATIVE
Protein, ur: NEGATIVE mg/dL
Specific Gravity, Urine: 1.02 (ref 1.005–1.030)
pH: 6.5 (ref 5.0–8.0)

## 2023-03-22 LAB — CBC WITH DIFFERENTIAL/PLATELET
Abs Immature Granulocytes: 0.01 10*3/uL (ref 0.00–0.07)
Basophils Absolute: 0.1 10*3/uL (ref 0.0–0.1)
Basophils Relative: 1 %
Eosinophils Absolute: 0.1 10*3/uL (ref 0.0–0.5)
Eosinophils Relative: 1 %
HCT: 34.1 % — ABNORMAL LOW (ref 36.0–46.0)
Hemoglobin: 10.5 g/dL — ABNORMAL LOW (ref 12.0–15.0)
Immature Granulocytes: 0 %
Lymphocytes Relative: 29 %
Lymphs Abs: 1.4 10*3/uL (ref 0.7–4.0)
MCH: 22.9 pg — ABNORMAL LOW (ref 26.0–34.0)
MCHC: 30.8 g/dL (ref 30.0–36.0)
MCV: 74.5 fL — ABNORMAL LOW (ref 80.0–100.0)
Monocytes Absolute: 0.4 10*3/uL (ref 0.1–1.0)
Monocytes Relative: 8 %
Neutro Abs: 2.9 10*3/uL (ref 1.7–7.7)
Neutrophils Relative %: 61 %
Platelets: 278 10*3/uL (ref 150–400)
RBC: 4.58 MIL/uL (ref 3.87–5.11)
RDW: 17.2 % — ABNORMAL HIGH (ref 11.5–15.5)
WBC: 4.8 10*3/uL (ref 4.0–10.5)
nRBC: 0 % (ref 0.0–0.2)

## 2023-03-22 LAB — COMPREHENSIVE METABOLIC PANEL
ALT: 12 U/L (ref 0–44)
AST: 19 U/L (ref 15–41)
Albumin: 3.9 g/dL (ref 3.5–5.0)
Alkaline Phosphatase: 83 U/L (ref 38–126)
Anion gap: 7 (ref 5–15)
BUN: 9 mg/dL (ref 6–20)
CO2: 24 mmol/L (ref 22–32)
Calcium: 8.8 mg/dL — ABNORMAL LOW (ref 8.9–10.3)
Chloride: 106 mmol/L (ref 98–111)
Creatinine, Ser: 0.9 mg/dL (ref 0.44–1.00)
GFR, Estimated: >60 mL/min (ref >60)
Glucose, Bld: 94 mg/dL (ref 70–99)
Potassium: 3.3 mmol/L — ABNORMAL LOW (ref 3.5–5.1)
Sodium: 137 mmol/L (ref 135–145)
Total Bilirubin: 0.6 mg/dL (ref 0.3–1.2)
Total Protein: 7.5 g/dL (ref 6.5–8.1)

## 2023-03-22 LAB — LIPASE, BLOOD: Lipase: 30 U/L (ref 11–51)

## 2023-03-22 LAB — TROPONIN I (HIGH SENSITIVITY): Troponin I (High Sensitivity): 2 ng/L (ref <18)

## 2023-03-22 LAB — HCG, SERUM, QUALITATIVE: Preg, Serum: NEGATIVE

## 2023-03-22 MED ORDER — SUCRALFATE 1 G PO TABS: 1.0000 g | ORAL_TABLET | Freq: Three times a day (TID) | ORAL | 0 refills | Status: DC

## 2023-03-22 MED ORDER — ALUM & MAG HYDROXIDE-SIMETH 200-200-20 MG/5ML PO SUSP
30.0000 mL | Freq: Once | ORAL | Status: AC
  Administered 2023-03-22: 30 mL via ORAL
  Filled 2023-03-22: qty 30

## 2023-03-22 NOTE — ED Notes (Signed)
Pt reports she no longer has a legal guardian. Had a legal guardian as a child

## 2023-03-22 NOTE — Discharge Instructions (Signed)
Your testing is reassuring.  Continue your lansoprazole.  And Carafate.  Avoid alcohol, caffeine, NSAID medications, spicy foods.  Follow-up with a gastroenterologist for consideration of endoscopy.  Return to the ED sooner with worsening abdominal pain, vomiting, black or bloody stools, not able to eat or drink or any other concerns

## 2023-03-22 NOTE — ED Provider Notes (Signed)
[facility] Provider Note   CSN: [id number] Arrival date & time: 03/22/23  1227     History  Chief Complaint  Patient presents with   Gastroesophageal Reflux    Meredith Yoder is a 37 y.o. female.  Patient complains of "indigestion" for the past 2 weeks.  She has been seen by her PCP and been placed on lansoprazole without relief.  States she has constant burning in the center of her chest that is worsened with eating.  Pain last for several hours at a time after eating but never really resolved.  Worse with lying down and worse with eating.  Denies radiation of the pain.  Nausea but no vomiting.  No black or bloody stools.  No significant epigastric pain or right upper quadrant pain.  No lower abdominal pain.  Denies exertional or pleuritic chest pain.  Denies any cardiac history.  She has never had an endoscopy.  She has been referred to gastroenterology but cannot see them until July.  States she does take NSAIDs occasionally for menstrual cramps but not on a regular basis.  Denies excessive caffeine or alcohol use. Still has appendix and gallbladder.  The history is provided by the patient.  Gastroesophageal Reflux Associated symptoms include chest pain. Pertinent negatives include no abdominal pain, no headaches and no shortness of breath.       Home Medications Prior to Admission medications   Medication Sig Start Date End Date Taking? Authorizing Provider  amLODipine (NORVASC) 5 MG tablet Take 10 mg by mouth daily.    [provider]  doxycycline (VIBRA-TABS) 100 MG tablet Take 100 mg by mouth 2 (two) times daily. 11/17/22   [provider]  ELDERBERRY PO Take by mouth.    [provider]  fluticasone (FLONASE) 50 MCG/ACT nasal spray Place 1 spray into both nostrils 2 (two) times daily as needed (nasal congestion). 11/20/22   Ellamae Sia, DO  ipratropium (ATROVENT) 0.03 % nasal spray Place 2 sprays  into both nostrils 2 (two) times daily. 11/17/22   [provider]  Multiple Vitamin (MULTIVITAMIN ADULT PO) Take by mouth.    [provider]  naproxen (NAPROSYN) 500 MG tablet Take 1 tablet (500 mg total) by mouth 2 (two) times daily. 11/01/20   Wieters, Hallie C, PA-C  fexofenadine (ALLEGRA) 60 MG tablet Take 60 mg by mouth 2 (two) times daily.  09/29/19  [provider]  metoprolol succinate (TOPROL-XL) 50 MG 24 hr tablet Take 50 mg by mouth daily. Take with or immediately following a meal.  09/29/19  [provider]  pantoprazole (PROTONIX) 40 MG tablet Take 1 tablet (40 mg total) by mouth daily. 05/09/18 09/29/19  Frederica Kuster, MD      Allergies    Escitalopram oxalate, Hydrochlorothiazide, and Sertraline hcl    Review of Systems   Review of Systems  Constitutional:  Negative for activity change, appetite change, fatigue and fever.  HENT:  Negative for congestion and rhinorrhea.   Eyes:  Negative for visual disturbance.  Respiratory:  Negative for cough, chest tightness and shortness of breath.   Cardiovascular:  Positive for chest pain.  Gastrointestinal:  Positive for nausea. Negative for abdominal pain and vomiting.  Genitourinary:  Negative for dysuria and hematuria.  Musculoskeletal:  Negative for arthralgias and myalgias.  Neurological:  Negative for dizziness, weakness and headaches.   all other systems are negative except as noted in the HPI and PMH.  Physical Exam Updated Vital Signs BP 126/89 (BP Location: Left Arm)   Pulse 100   Temp 98.4 F (36.9 C) (Oral)   Resp 18   Wt 114.8 kg   LMP 03/21/2023 (Exact Date)   SpO2 100%   BMI 41.46 kg/m  Physical Exam Vitals and nursing note reviewed.  Constitutional:      General: She is not in acute distress.    Appearance: She is well-developed.  HENT:     Head: Normocephalic and atraumatic.     Mouth/Throat:     Pharynx: No oropharyngeal exudate.  Eyes:      Conjunctiva/sclera: Conjunctivae normal.     Pupils: Pupils are equal, round, and reactive to light.  Neck:     Comments: No meningismus. Cardiovascular:     Rate and Rhythm: Normal rate and regular rhythm.     Heart sounds: Normal heart sounds. No murmur heard. Pulmonary:     Effort: Pulmonary effort is normal. No respiratory distress.     Breath sounds: Normal breath sounds.  Chest:     Chest wall: No tenderness.  Abdominal:     Palpations: Abdomen is soft.     Tenderness: There is no abdominal tenderness. There is no guarding or rebound.  Musculoskeletal:        General: No tenderness. Normal range of motion.     Cervical back: Normal range of motion and neck supple.  Skin:    General: Skin is warm.  Neurological:     Mental Status: She is alert and oriented to person, place, and time.     Cranial Nerves: No cranial nerve deficit.     Motor: No abnormal muscle tone.     Coordination: Coordination normal.     Comments:  5/5 strength throughout. CN 2-12 intact.Equal grip strength.   Psychiatric:        Behavior: Behavior normal.     ED Results / Procedures / Treatments   Labs (all labs ordered are listed, but only abnormal results are displayed) Labs Reviewed  CBC WITH DIFFERENTIAL/PLATELET - Abnormal; Notable for the following components:      Result Value   Hemoglobin 10.5 (*)    HCT 34.1 (*)    MCV 74.5 (*)    MCH 22.9 (*)    RDW 17.2 (*)    All other components within normal limits  COMPREHENSIVE METABOLIC PANEL - Abnormal; Notable for the following components:   Potassium 3.3 (*)    Calcium 8.8 (*)    All other components within normal limits  LIPASE, BLOOD  HCG, SERUM, QUALITATIVE  URINALYSIS, ROUTINE W REFLEX MICROSCOPIC  TROPONIN I (HIGH SENSITIVITY)  TROPONIN I (HIGH SENSITIVITY)    EKG EKG Interpretation  Date/Time:  Sunday March 22 2023 13:31:29 EDT Ventricular Rate:  94 PR Interval:  159 QRS Duration: 80 QT Interval:  363 QTC  Calculation: 454 R Axis:   76 Text Interpretation: Sinus rhythm No significant change was found Confirmed by Glynn Octave [phone]) on 03/22/2023 1:44:54 PM  Radiology No results found.  Procedures Procedures    Medications Ordered in ED Medications - No data to display  ED Course/ Medical Decision Making/ A&P                             Medical Decision Making Amount and/or Complexity of Data Reviewed Labs: ordered. Decision-making details documented in ED Course. Radiology: ordered and independent interpretation performed. Decision-making details documented in ED  Course. ECG/medicine tests: ordered and independent interpretation performed. Decision-making details documented in ED Course.  Risk OTC drugs. Prescription drug management.   2 weeks of "indigestion" worsening with eating and worsening with lying flat.  Nausea but no vomiting.  No epigastric or right upper quadrant pain.  No exertional chest pain.  Patient given GI cocktail.  Labs show normal LFTs and lipase.  No significant leukocytosis.  Mildly decreased hemoglobin but not significantly anemic.  Low suspicion for gallbladder or pancreas pathology.  Abdomen is soft without peritoneal signs. Symptoms improved with Maalox.  Continue PPI.  Avoid alcohol, caffeine, NSAID medications, spicy foods.  Will refer to gastroenterology for consideration of endoscopy. Add carafate.   Return to the ED with worsening pain, intractable nausea vomiting, black or bloody stools, unable to eat or drink or any other concerns       Final Clinical Impression(s) / ED Diagnoses Final diagnoses:  Heartburn    Rx / DC Orders ED Discharge Orders     None         Ketzia Guzek, Jeannett Senior, MD 03/22/23 1523

## 2023-03-22 NOTE — ED Triage Notes (Signed)
Indigestion x 10 days , saw PCP , meds prescribed , no relief .

## 2023-05-20 NOTE — Progress Notes (Deleted)
Follow Up Note  RE: Meredith Yoder MRN: [id number] DOB: Sep 03, 1986 Date of Office Visit: 05/21/2023  Referring provider: Merri Brunette, MD Primary care provider: Merri Brunette, MD  Chief Complaint: No chief complaint on file.  History of Present Illness: I had the pleasure of seeing Meredith Yoder for a follow up visit at the [facility] on 05/20/2023. She is a 37 y.o. female, who is being followed for allergic rhinitis and frequent sinusitis. Her previous allergy office visit was on 11/20/2022 with Dr. Selena Batten. Today is a regular follow up visit.  Other allergic rhinitis Past history - Perennial rhino conjunctivitis symptoms for 1-2 years. Tried OTC antihistamines, Flonase, Afrin with no benefit. No prior ENT evaluation. 2023 skin testing showed: Positive to grass and mold.  Interim history - was doing well up until last month and now on 2nd course of antibiotics but still having significant sinus pressure. Did not see ENT yet.  Continue environmental control measures as below. Use over the counter antihistamines such as Zyrtec (cetirizine), Claritin (loratadine), Allegra (fexofenadine), or Xyzal (levocetirizine) daily as needed. May take twice a day during allergy flares. May switch antihistamines every few months. Use Flonase (fluticasone) nasal spray 1 spray per nostril twice a day as needed for nasal congestion.  May use over the counter Advil sinus decongestant for a few days at a time only.  Use Atrovent (ipratropium) 0.03% 1-2 sprays per nostril twice a day as needed for runny nose/drainage. Nasal saline spray (i.e., Simply Saline) or nasal saline lavage (i.e., NeilMed) is recommended as needed and prior to medicated nasal sprays. Recommend ENT evaluation next to look at sinus anatomy. [facility] - chronic sinusitis.  Finish antibiotics.    Frequent episodes of sinusitis Past history - 2 sinus infections the past year.  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   Interim history - 1 infection requiring amoxicillin and doxy.  Keep track of infections/antibiotics use. Consider getting basic immune evaluation bloodwork next if worsening.   Return in about 6 months (around 05/22/2023).  01/13/2023 ENT visit: "Findings: No polyps. No purulence. Septal spur noted on left Impression & Plans:   1) allergic rhinitis 2) recurrent sinusitis  Continue current medications If she feels she is developing another infection I have encouraged her to contact us so we can see her. If recurrent pattern persists we will consider imaging"  Assessment and Plan: Meredith Yoder is a 37 y.o. female with: No problem-specific Assessment & Plan notes found for this encounter.  No follow-ups on file.  No orders of the defined types were placed in this encounter.  Lab Orders  No laboratory test(s) ordered today    Diagnostics: Spirometry:  Tracings reviewed. Her effort: {Blank single:19197::"Good reproducible efforts.","It was hard to get consistent efforts and there is a question as to whether this reflects a maximal maneuver.","Poor effort, data can not be interpreted."} FVC: ***L FEV1: ***L, ***% predicted FEV1/FVC ratio: ***% Interpretation: {Blank single:19197::"Spirometry consistent with mild obstructive disease","Spirometry consistent with moderate obstructive disease","Spirometry consistent with severe obstructive disease","Spirometry consistent with possible restrictive disease","Spirometry consistent with mixed obstructive and restrictive disease","Spirometry uninterpretable due to technique","Spirometry consistent with normal pattern","No overt abnormalities noted given today's efforts"}.  Please see scanned spirometry results for details.  Skin Testing: {Blank single:19197::"Select foods","Environmental allergy panel","Environmental allergy panel and select foods","Food allergy panel","None","Deferred due to recent antihistamines  use"}. *** Results discussed with patient/family.   Medication List:  Current Outpatient Medications  Medication Sig Dispense Refill  amLODipine (NORVASC) 5 MG tablet Take 10 mg by mouth daily.     doxycycline (VIBRA-TABS) 100 MG tablet Take 100 mg by mouth 2 (two) times daily.     ELDERBERRY PO Take by mouth.     fluticasone (FLONASE) 50 MCG/ACT nasal spray Place 1 spray into both nostrils 2 (two) times daily as needed (nasal congestion). 16 g 3   ipratropium (ATROVENT) 0.03 % nasal spray Place 2 sprays into both nostrils 2 (two) times daily.     Multiple Vitamin (MULTIVITAMIN ADULT PO) Take by mouth.     naproxen (NAPROSYN) 500 MG tablet Take 1 tablet (500 mg total) by mouth 2 (two) times daily. 30 tablet 0   sucralfate (CARAFATE) 1 g tablet Take 1 tablet (1 g total) by mouth 4 (four) times daily -  with meals and at bedtime. 60 tablet 0   No current facility-administered medications for this visit.   Allergies: Allergies  Allergen Reactions   Escitalopram Oxalate Other (See Comments)   Hydrochlorothiazide Other (See Comments)   Sertraline Hcl Other (See Comments)   I reviewed her past medical history, social history, family history, and environmental history and no significant changes have been reported from her previous visit.  Review of Systems  Constitutional:  Negative for appetite change, chills, fever and unexpected weight change.  HENT:  Positive for congestion, sinus pressure and sinus pain. Negative for postnasal drip and rhinorrhea.   Eyes:  Negative for itching.  Respiratory:  Negative for cough, chest tightness, shortness of breath and wheezing.   Cardiovascular:  Negative for chest pain.  Gastrointestinal:  Negative for abdominal pain.  Genitourinary:  Negative for difficulty urinating.  Skin:  Negative for rash.  Allergic/Immunologic: Positive for environmental allergies. Negative for food allergies.  Neurological:  Positive for headaches.     Objective: There were no vitals taken for this visit. There is no height or weight on file to calculate BMI. Physical Exam Vitals and nursing note reviewed.  Constitutional:      Appearance: Normal appearance. She is well-developed.  HENT:     Head: Normocephalic and atraumatic.     Right Ear: Tympanic membrane and external ear normal.     Left Ear: Tympanic membrane and external ear normal.     Nose: Congestion present.     Mouth/Throat:     Mouth: Mucous membranes are moist.     Comments: Few white spots on tonsils Eyes:     Conjunctiva/sclera: Conjunctivae normal.  Cardiovascular:     Rate and Rhythm: Normal rate and regular rhythm.     Heart sounds: Normal heart sounds. No murmur heard.    No friction rub. No gallop.  Pulmonary:     Effort: Pulmonary effort is normal.     Breath sounds: Normal breath sounds. No wheezing, rhonchi or rales.  Musculoskeletal:     Cervical back: Neck supple.  Skin:    General: Skin is warm.     Findings: No rash.  Neurological:     Mental Status: She is alert and oriented to person, place, and time.  Psychiatric:        Behavior: Behavior normal.    Previous notes and tests were reviewed. The plan was reviewed with the patient/family, and all questions/concerned were addressed.  It was my pleasure to see Meredith Yoder today and participate in her care. Please feel free to contact me with any questions or concerns.  Sincerely,  Wyline Mood, DO Allergy & Immunology  [facility] office: [place] office: [phone]

## 2023-05-21 ENCOUNTER — Ambulatory Visit: Payer: BC Managed Care – PPO | Admitting: Allergy

## 2024-03-31 ENCOUNTER — Other Ambulatory Visit: Payer: Self-pay | Admitting: Obstetrics and Gynecology

## 2024-04-01 ENCOUNTER — Encounter (HOSPITAL_COMMUNITY): Payer: Self-pay | Admitting: Obstetrics and Gynecology

## 2024-04-01 NOTE — Progress Notes (Signed)
 Spoke w/ via phone for pre-op interview--- Meredith Yoder Lab needs dos----  CBC and UPT per Careers adviser. BMP and EKG per anesthesia.       Lab results------ COVID test -----patient states asymptomatic no test needed Arrive at -------1245 NPO after MN NO Solid Food.  Clear liquids from MN until---1145 Pre-Surgery Ensure or G2:  Med rec completed Medications to take morning of surgery -----Norvasc Diabetic medication -----  GLP1 agonist last dose: GLP1 instructions:  Patient instructed no nail polish to be worn day of surgery Patient instructed to bring photo id and insurance card day of surgery Patient aware to have Driver (ride ) / caregiver    for 24 hours after surgery - Mother Meredith Yoder Patient Special Instructions ----- Shower with antibacterial soap. Pre-Op special Instructions -----  Patient verbalized understanding of instructions that were given at this phone interview. Patient denies chest pain, sob, fever, cough at the interview.

## 2024-04-11 DIAGNOSIS — Z01818 Encounter for other preprocedural examination: Secondary | ICD-10-CM

## 2024-04-11 NOTE — Progress Notes (Addendum)
 Left voicemail for pt regarding updated arrival time of 1000 for procedure tomorrow. Reviewed NPO guidelines with clear liquids until 0900. Requested pt to call back to confirm.   12:13 - Patient called back to confirm new arrival time of 1000 and NPO instructions. Pt verbalized understanding.

## 2024-04-12 ENCOUNTER — Ambulatory Visit (HOSPITAL_COMMUNITY)
Admission: RE | Admit: 2024-04-12 | Discharge: 2024-04-12 | Disposition: A | Discharge status: Home / Self Care | Payer: Self-pay | Attending: Obstetrics and Gynecology | Admitting: Obstetrics and Gynecology

## 2024-04-12 ENCOUNTER — Ambulatory Visit (HOSPITAL_BASED_OUTPATIENT_CLINIC_OR_DEPARTMENT_OTHER): Payer: Self-pay | Admitting: Anesthesiology

## 2024-04-12 ENCOUNTER — Encounter (HOSPITAL_COMMUNITY): Payer: Self-pay | Admitting: Obstetrics and Gynecology

## 2024-04-12 ENCOUNTER — Ambulatory Visit (HOSPITAL_COMMUNITY): Payer: Self-pay | Admitting: Anesthesiology

## 2024-04-12 ENCOUNTER — Encounter (HOSPITAL_COMMUNITY)
Admission: RE | Disposition: A | Discharge status: Home / Self Care | Payer: Self-pay | Source: Home / Self Care | Attending: Obstetrics and Gynecology

## 2024-04-12 DIAGNOSIS — D250 Submucous leiomyoma of uterus: Secondary | ICD-10-CM | POA: Diagnosis not present

## 2024-04-12 DIAGNOSIS — Z79899 Other long term (current) drug therapy: Secondary | ICD-10-CM | POA: Insufficient documentation

## 2024-04-12 DIAGNOSIS — N840 Polyp of corpus uteri: Secondary | ICD-10-CM | POA: Diagnosis not present

## 2024-04-12 DIAGNOSIS — I10 Essential (primary) hypertension: Secondary | ICD-10-CM | POA: Diagnosis not present

## 2024-04-12 DIAGNOSIS — N920 Excessive and frequent menstruation with regular cycle: Secondary | ICD-10-CM | POA: Diagnosis present

## 2024-04-12 DIAGNOSIS — Z01818 Encounter for other preprocedural examination: Secondary | ICD-10-CM

## 2024-04-12 HISTORY — PX: MYOSURE RESECTION: SHX7611

## 2024-04-12 HISTORY — PX: HYSTEROSCOPY WITH MYOMECTOMY: SHX7591

## 2024-04-12 LAB — BASIC METABOLIC PANEL WITH GFR
Anion gap: 9 (ref 5–15)
BUN: 6 mg/dL (ref 6–20)
CO2: 24 mmol/L (ref 22–32)
Calcium: 9 mg/dL (ref 8.9–10.3)
Chloride: 106 mmol/L (ref 98–111)
Creatinine, Ser: 0.76 mg/dL (ref 0.44–1.00)
GFR, Estimated: >60 mL/min (ref >60)
Glucose, Bld: 82 mg/dL (ref 70–99)
Potassium: 3.4 mmol/L — ABNORMAL LOW (ref 3.5–5.1)
Sodium: 139 mmol/L (ref 135–145)

## 2024-04-12 LAB — CBC
HCT: 36.7 % (ref 36.0–46.0)
Hemoglobin: 11.3 g/dL — ABNORMAL LOW (ref 12.0–15.0)
MCH: 22.1 pg — ABNORMAL LOW (ref 26.0–34.0)
MCHC: 30.8 g/dL (ref 30.0–36.0)
MCV: 71.8 fL — ABNORMAL LOW (ref 80.0–100.0)
Platelets: 256 10*3/uL (ref 150–400)
RBC: 5.11 MIL/uL (ref 3.87–5.11)
RDW: 18.9 % — ABNORMAL HIGH (ref 11.5–15.5)
WBC: 4.6 10*3/uL (ref 4.0–10.5)
nRBC: 0 % (ref 0.0–0.2)

## 2024-04-12 LAB — POCT PREGNANCY, URINE: Preg Test, Ur: NEGATIVE

## 2024-04-12 SURGERY — HYSTEROSCOPY WITH MYOMECTOMY
Anesthesia: General | Site: Uterus

## 2024-04-12 MED ORDER — FENTANYL CITRATE (PF) 100 MCG/2ML IJ SOLN
INTRAMUSCULAR | Status: DC | PRN
  Administered 2024-04-12: 50 ug via INTRAVENOUS
  Administered 2024-04-12: 100 ug via INTRAVENOUS

## 2024-04-12 MED ORDER — PROPOFOL 10 MG/ML IV BOLUS
INTRAVENOUS | Status: AC
  Filled 2024-04-12: qty 20

## 2024-04-12 MED ORDER — CHLORHEXIDINE GLUCONATE 0.12 % MT SOLN
OROMUCOSAL | Status: AC
  Filled 2024-04-12: qty 15

## 2024-04-12 MED ORDER — LIDOCAINE 2% (20 MG/ML) 5 ML SYRINGE
INTRAMUSCULAR | Status: AC
  Filled 2024-04-12: qty 5

## 2024-04-12 MED ORDER — OXYCODONE HCL 5 MG PO TABS: 5.0000 mg | ORAL_TABLET | Freq: Once | ORAL | Status: DC | PRN

## 2024-04-12 MED ORDER — HYDROMORPHONE HCL 1 MG/ML IJ SOLN
0.2500 mg | INTRAMUSCULAR | Status: DC | PRN
Start: 2024-04-12 — End: 2024-04-12

## 2024-04-12 MED ORDER — MIDAZOLAM HCL 2 MG/2ML IJ SOLN
INTRAMUSCULAR | Status: AC
  Filled 2024-04-12: qty 2

## 2024-04-12 MED ORDER — ONDANSETRON HCL 4 MG/2ML IJ SOLN: 4.0000 mg | Freq: Once | INTRAMUSCULAR | Status: DC | PRN

## 2024-04-12 MED ORDER — ACETAMINOPHEN 500 MG PO TABS
ORAL_TABLET | ORAL | Status: AC
  Filled 2024-04-12: qty 2

## 2024-04-12 MED ORDER — AMISULPRIDE (ANTIEMETIC) 5 MG/2ML IV SOLN: 10.0000 mg | Freq: Once | INTRAVENOUS | Status: DC | PRN

## 2024-04-12 MED ORDER — ORAL CARE MOUTH RINSE: 15.0000 mL | Freq: Once | OROMUCOSAL | Status: AC

## 2024-04-12 MED ORDER — SCOPOLAMINE 1 MG/3DAYS TD PT72
MEDICATED_PATCH | TRANSDERMAL | Status: AC
  Filled 2024-04-12: qty 1

## 2024-04-12 MED ORDER — ACETAMINOPHEN 500 MG PO TABS
1000.0000 mg | ORAL_TABLET | Freq: Once | ORAL | Status: AC
  Administered 2024-04-12: 1000 mg via ORAL

## 2024-04-12 MED ORDER — SODIUM CHLORIDE 0.9 % IR SOLN
Status: DC | PRN
  Administered 2024-04-12: 3000 mL

## 2024-04-12 MED ORDER — ONDANSETRON HCL 4 MG/2ML IJ SOLN
INTRAMUSCULAR | Status: DC | PRN
  Administered 2024-04-12: 4 mg via INTRAVENOUS

## 2024-04-12 MED ORDER — POVIDONE-IODINE 10 % EX SWAB: 2.0000 | Freq: Once | CUTANEOUS | Status: DC

## 2024-04-12 MED ORDER — KETOROLAC TROMETHAMINE 30 MG/ML IJ SOLN
INTRAMUSCULAR | Status: DC | PRN
  Administered 2024-04-12: 30 mg via INTRAVENOUS

## 2024-04-12 MED ORDER — DEXAMETHASONE SODIUM PHOSPHATE 10 MG/ML IJ SOLN
INTRAMUSCULAR | Status: DC | PRN
  Administered 2024-04-12: 10 mg via INTRAVENOUS

## 2024-04-12 MED ORDER — LIDOCAINE 2% (20 MG/ML) 5 ML SYRINGE
INTRAMUSCULAR | Status: DC | PRN
  Administered 2024-04-12: 60 mg via INTRAVENOUS

## 2024-04-12 MED ORDER — DEXAMETHASONE SODIUM PHOSPHATE 10 MG/ML IJ SOLN
INTRAMUSCULAR | Status: AC
  Filled 2024-04-12: qty 1

## 2024-04-12 MED ORDER — CHLORHEXIDINE GLUCONATE 0.12 % MT SOLN
15.0000 mL | Freq: Once | OROMUCOSAL | Status: AC
  Administered 2024-04-12: 15 mL via OROMUCOSAL

## 2024-04-12 MED ORDER — PROPOFOL 10 MG/ML IV BOLUS
INTRAVENOUS | Status: DC | PRN
  Administered 2024-04-12: 200 mg via INTRAVENOUS
  Administered 2024-04-12: 30 mg via INTRAVENOUS
  Administered 2024-04-12: 20 mg via INTRAVENOUS

## 2024-04-12 MED ORDER — KETOROLAC TROMETHAMINE 30 MG/ML IJ SOLN: 30.0000 mg | Freq: Once | INTRAMUSCULAR | Status: DC | PRN

## 2024-04-12 MED ORDER — LACTATED RINGERS IV SOLN: INTRAVENOUS | Status: DC

## 2024-04-12 MED ORDER — MEPERIDINE HCL 25 MG/ML IJ SOLN: 6.2500 mg | INTRAMUSCULAR | Status: DC | PRN

## 2024-04-12 MED ORDER — IBUPROFEN 800 MG PO TABS: 800.0000 mg | ORAL_TABLET | Freq: Three times a day (TID) | ORAL | 1 refills | Status: DC | PRN

## 2024-04-12 MED ORDER — OXYCODONE HCL 5 MG/5ML PO SOLN: 5.0000 mg | Freq: Once | ORAL | Status: DC | PRN

## 2024-04-12 MED ORDER — SCOPOLAMINE 1 MG/3DAYS TD PT72
1.0000 | MEDICATED_PATCH | TRANSDERMAL | Status: DC
  Administered 2024-04-12: 1.5 mg via TRANSDERMAL

## 2024-04-12 MED ORDER — FENTANYL CITRATE (PF) 250 MCG/5ML IJ SOLN
INTRAMUSCULAR | Status: AC
  Filled 2024-04-12: qty 5

## 2024-04-12 MED ORDER — DEXMEDETOMIDINE HCL IN NACL 80 MCG/20ML IV SOLN
INTRAVENOUS | Status: DC | PRN
  Administered 2024-04-12 (×2): 8 ug via INTRAVENOUS

## 2024-04-12 MED ORDER — MIDAZOLAM HCL 5 MG/5ML IJ SOLN
INTRAMUSCULAR | Status: DC | PRN
  Administered 2024-04-12: 2 mg via INTRAVENOUS

## 2024-04-12 SURGICAL SUPPLY — 15 items
CANISTER SUCTION 3000ML PPV (SUCTIONS) ×1 IMPLANT
CATH ROBINSON RED A/P 16FR (CATHETERS) ×1 IMPLANT
DEVICE MYOSURE LITE (MISCELLANEOUS) IMPLANT
DEVICE MYOSURE REACH (MISCELLANEOUS) IMPLANT
GLOVE ECLIPSE 6.5 STRL STRAW (GLOVE) ×1 IMPLANT
GLOVE SURG UNDER POLY LF SZ7 (GLOVE) ×2 IMPLANT
GOWN STRL REUS W/ TWL LRG LVL3 (GOWN DISPOSABLE) ×2 IMPLANT
KIT PROCEDURE FLUENT (KITS) ×1 IMPLANT
KIT TURNOVER KIT B (KITS) ×1 IMPLANT
PACK VAGINAL MINOR WOMEN LF (CUSTOM PROCEDURE TRAY) ×1 IMPLANT
PAD OB MATERNITY 11 LF (PERSONAL CARE ITEMS) ×1 IMPLANT
SEAL CERVICAL OMNI LOK (ABLATOR) IMPLANT
SEAL ROD LENS SCOPE MYOSURE (ABLATOR) ×1 IMPLANT
TOWEL GREEN STERILE FF (TOWEL DISPOSABLE) ×2 IMPLANT
UNDERPAD 30X36 HEAVY ABSORB (UNDERPADS AND DIAPERS) ×1 IMPLANT

## 2024-04-12 NOTE — Anesthesia Postprocedure Evaluation (Signed)
 Anesthesia Post Note  Patient: Meredith Yoder  Procedure(s) Performed: HYSTEROSCOPY WITH MYOMECTOMY (Uterus) MYOSURE RESECTION (Uterus)     Patient location during evaluation: PACU Anesthesia Type: General Level of consciousness: awake and alert, oriented and patient cooperative Pain management: pain level controlled Vital Signs Assessment: post-procedure vital signs reviewed and stable Respiratory status: spontaneous breathing, nonlabored ventilation and respiratory function stable Cardiovascular status: blood pressure returned to baseline and stable Postop Assessment: no apparent nausea or vomiting Anesthetic complications: no   No notable events documented.  Last Vitals:  Vitals:   04/12/24 1018 04/12/24 1200  BP: (!) 153/91 118/70  Pulse: (!) 112 87  Resp: 18 19  Temp: 36.8 C 36.4 C  SpO2: 100% 96%    Last Pain:  Vitals:   04/12/24 1200  TempSrc:   PainSc: Asleep                 Jacquelyne Matte

## 2024-04-12 NOTE — H&P (Signed)
 Meredith Yoder is an 38 y.o. female. G1P0010 SF presents for dx hysteroscopy, hysteroscopic resection of SM fibroid using myosure, D&C due to menorrhagia and SM fibroids  Pertinent Gynecological History: Menses: flow is excessive with use of 5 pads or tampons on heaviest days Bleeding: menorrhagia Contraception: none DES exposure: denies Blood transfusions: none Sexually transmitted diseases: no past history Previous GYN Procedures: none  Last mammogram: n/a Date: n/a Last pap: normal Date: 2024 OB History: G1, P0010   Menstrual History: Menarche age: n/a Patient's last menstrual period was 03/28/2024 (approximate).    Past Medical History:  Diagnosis Date   Hypertension    Murmur     Past Surgical History:  Procedure Laterality Date   DENTAL SURGERY      Family History  Problem Relation Age of Onset   Healthy Mother    Hypertension Father    Allergic rhinitis Maternal Grandmother    Asthma Neg Hx    Eczema Neg Hx    Urticaria Neg Hx     Social History:  reports that she has never smoked. She has never used smokeless tobacco. She reports that she does not drink alcohol and does not use drugs.  Allergies:  Allergies  Allergen Reactions   Escitalopram Oxalate Other (See Comments)   Hydrochlorothiazide Other (See Comments)   Sertraline Hcl Other (See Comments)    No medications prior to admission.    Review of Systems  All other systems reviewed and are negative.   Height 5\' 5"  (1.651 m), weight 108.9 kg, last menstrual period 03/28/2024. Physical Exam Constitutional:      Appearance: Normal appearance.  HENT:     Head: Atraumatic.  Eyes:     Extraocular Movements: Extraocular movements intact.  Cardiovascular:     Rate and Rhythm: Regular rhythm.     Heart sounds: Normal heart sounds.  Pulmonary:     Breath sounds: Normal breath sounds.  Abdominal:     Palpations: Abdomen is soft.  Genitourinary:    General: Normal vulva.     Comments:  Vagina nl Cervix parous Uterus enlarged irreg Adnexa nl Musculoskeletal:        General: Normal range of motion.     Cervical back: Neck supple.  Skin:    General: Skin is warm and dry.  Neurological:     General: No focal deficit present.     Mental Status: She is alert and oriented to person, place, and time.  Psychiatric:        Mood and Affect: Mood normal.        Behavior: Behavior normal.     No results found for this or any previous visit (from the past 24 hours).  No results found.  Assessment/Plan: Menorrhagia Submucosal fibroid P) dx hysteroscopy, hysteroscopic resection of SM fibroid using myosure, D&C. Procedure explained. Risk of surgery reviewed including infection, bleeding, injury to surrounding organ structures, thermal injury, fluid overload and its mgmt, possible inability to fully resect fibroid in one sitting. All ? answered  Audi Conover A Noreta Kue 04/12/2024, 4:19 AM

## 2024-04-12 NOTE — Interval H&P Note (Signed)
 History and Physical Interval Note:  04/12/2024 10:15 AM  Meredith Yoder  has presented today for surgery, with the diagnosis of Intramural and subserous leiomyoma Menorrhagia.  The various methods of treatment have been discussed with the patient and family. After consideration of risks, benefits and other options for treatment, the patient has consented to  PROCEDURE: DIAGNOSTIC HYSTEROSCOPY, HYSTEROSCOPIC RESECTION OF SUBMUCOSAL FIBROIDS USING MYOSURE, DILATION AND CURETTAGE as a surgical intervention.  The patient's history has been reviewed, patient examined, no change in status, stable for surgery.  I have reviewed the patient's chart and labs.  Questions were answered to the patient's satisfaction.     Inika Bellanger A Venus Ruhe

## 2024-04-12 NOTE — Discharge Instr - Supplementary Instructions (Signed)
 May take Tylenol after 4:30pm if needed for discomfort.

## 2024-04-12 NOTE — Anesthesia Preprocedure Evaluation (Addendum)
 Anesthesia Evaluation  Patient identified by MRN, date of birth, ID band Patient awake    Reviewed: Allergy  & Precautions, NPO status , Patient's Chart, lab work & pertinent test results  Airway Mallampati: III  TM Distance: >3 FB Neck ROM: Full    Dental  (+) Teeth Intact, Dental Advisory Given   Pulmonary neg pulmonary ROS   Pulmonary exam normal breath sounds clear to auscultation       Cardiovascular hypertension (153/91 preop), Pt. on medications Normal cardiovascular exam Rhythm:Regular Rate:Normal     Neuro/Psych negative neurological ROS  negative psych ROS   GI/Hepatic negative GI ROS, Neg liver ROS,,,  Endo/Other    Class 3 obesityBMI 40  Renal/GU negative Renal ROS  negative genitourinary   Musculoskeletal negative musculoskeletal ROS (+)    Abdominal  (+) + obese  Peds  Hematology negative hematology ROS (+)   Anesthesia Other Findings   Reproductive/Obstetrics negative OB ROS                             Anesthesia Physical Anesthesia Plan  ASA: 3  Anesthesia Plan: General   Post-op Pain Management: Tylenol  PO (pre-op)*, Toradol IV (intra-op)* and Precedex   Induction: Intravenous  PONV Risk Score and Plan: 4 or greater and Ondansetron , Dexamethasone, Midazolam and Treatment may vary due to age or medical condition  Airway Management Planned: LMA  Additional Equipment: None  Intra-op Plan:   Post-operative Plan: Extubation in OR  Informed Consent: I have reviewed the patients History and Physical, chart, labs and discussed the procedure including the risks, benefits and alternatives for the proposed anesthesia with the patient or authorized representative who has indicated his/her understanding and acceptance.     Dental advisory given  Plan Discussed with: CRNA  Anesthesia Plan Comments:        Anesthesia Quick Evaluation

## 2024-04-12 NOTE — Anesthesia Procedure Notes (Signed)
 Procedure Name: LMA Insertion Date/Time: 04/12/2024 11:01 AM  Performed by: Ezzie Holstein, CRNAPre-anesthesia Checklist: Patient identified, Emergency Drugs available, Suction available and Patient being monitored Patient Re-evaluated:Patient Re-evaluated prior to induction Oxygen Delivery Method: Circle System Utilized Preoxygenation: Pre-oxygenation with 100% oxygen Induction Type: IV induction Ventilation: Mask ventilation without difficulty LMA: LMA inserted LMA Size: 4.0 Number of attempts: 1 Placement Confirmation: positive ETCO2 Tube secured with: Tape Dental Injury: Teeth and Oropharynx as per pre-operative assessment

## 2024-04-12 NOTE — Transfer of Care (Signed)
 Immediate Anesthesia Transfer of Care Note  Patient: Meredith Yoder  Procedure(s) Performed: HYSTEROSCOPY WITH MYOMECTOMY (Uterus) MYOSURE RESECTION (Uterus)  Patient Location: PACU  Anesthesia Type:General  Level of Consciousness: awake, alert , oriented, and patient cooperative  Airway & Oxygen Therapy: Patient Spontanous Breathing  Post-op Assessment: Report given to RN and Post -op Vital signs reviewed and stable  Post vital signs: Reviewed and stable  Last Vitals:  Vitals Value Taken Time  BP 118/70 04/12/24 1200  Temp    Pulse 90 04/12/24 1201  Resp 19 04/12/24 1201  SpO2 96 % 04/12/24 1201  Vitals shown include unfiled device data.  Last Pain:  Vitals:   04/12/24 1018  TempSrc: Oral  PainSc: 0-No pain      Patients Stated Pain Goal: 5 (04/12/24 1018)  Complications: No notable events documented.

## 2024-04-12 NOTE — Brief Op Note (Signed)
 04/12/2024  11:57 AM  PATIENT:  Meredith Yoder  38 y.o. female  PRE-OPERATIVE DIAGNOSIS:  Intramural and submucosal leiomyoma Menorrhagia  POST-OPERATIVE DIAGNOSIS:  Intramural and submucosal  leiomyoma, Menorrhagia. Endometrial polyp  PROCEDURE:  diagnostic hysteroscopy, hysteroscopic resection of submucosal/intracavitary fibroid and endometrial polyp using myosure, dilation and curettage  SURGEON:  Surgeons and Role:    * Ivery Marking, MD - Primary  PHYSICIAN ASSISTANT:   ASSISTANTS: none   ANESTHESIA:   general FINDINGS: right lateral pedunculated/intracavitary submucosal fibroid , endometrial polyps EBL:  5 mL   BLOOD ADMINISTERED:none  DRAINS: none   LOCAL MEDICATIONS USED:  NONE  SPECIMEN:  Source of Specimen:  emc with fibroid resection and polyp  DISPOSITION OF SPECIMEN:  PATHOLOGY  COUNTS:  YES  TOURNIQUET:  * No tourniquets in log *  DICTATION: .Other Dictation: Dictation Number [id number]  PLAN OF CARE: Discharge to home after PACU  PATIENT DISPOSITION:  PACU - hemodynamically stable.   Delay start of Pharmacological VTE agent (>24hrs) due to surgical blood loss or risk of bleeding: no

## 2024-04-12 NOTE — Discharge Instructions (Addendum)
 CALL  IF TEMP>100.4, NOTHING PER VAGINA X 2 WK, CALL IF SOAKING A MAXI  PAD EVERY HOUR OR MORE FREQUENTLY  DO NOT TAKE TYLENOL  UNTIL AFTER 4:30PM DO NOT TAKE MOTRIN  UNTIL AFTER 5PM Post Anesthesia Home Care Instructions  Activity: Get plenty of rest for the remainder of the day. A responsible adult should stay with you for 24 hours following the procedure.  For the next 24 hours, DO NOT: -Drive a car -Advertising copywriter -Drink alcoholic beverages -Take any medication unless instructed by your physician -Make any legal decisions or sign important papers.  Meals: Start with liquid foods such as gelatin or soup. Progress to regular foods as tolerated. Avoid greasy, spicy, heavy foods. If nausea and/or vomiting occur, drink only clear liquids until the nausea and/or vomiting subsides. Call your physician if vomiting continues.  Special Instructions/Symptoms: Your throat may feel dry or sore from the anesthesia or the breathing tube placed in your throat during surgery. If this causes discomfort, gargle with warm salt water. The discomfort should disappear within 24 hours.  If you had a scopolamine patch placed behind your ear for the management of post- operative nausea and/or vomiting:  1. The medication in the patch is effective for 72 hours, after which it should be removed.  Wrap patch in a tissue and discard in the trash. Wash hands thoroughly with soap and water. 2. You may remove the patch earlier than 72 hours if you experience unpleasant side effects which may include dry mouth, dizziness or visual disturbances. 3. Avoid touching the patch. Wash your hands with soap and water after contact with the patch.  Call your surgeon if you experience:   1.  Fever over 101.0. 2.  Inability to urinate. 3.  Nausea and/or vomiting. 4.  Extreme swelling or bruising at the surgical site. 5.  Continued bleeding from the incision. 6.  Increased pain, redness or drainage from the incision. 7.   Problems related to your pain medication. 8. Any change in color, movement and/or sensation 9. Any problems and/or concerns

## 2024-04-12 NOTE — Op Note (Unsigned)
 NAME: Meredith Yoder, Meredith Yoder MEDICAL RECORD NO: [id number] ACCOUNT NO: [id number] DATE OF BIRTH: 01/25/1986 FACILITY: [facility] LOCATION: [facility] PHYSICIAN: Jinnifer Montejano A. Lesta Rater, MD  Operative Report   DATE OF PROCEDURE: 04/12/2024  PREOPERATIVE DIAGNOSIS:  Menorrhagia, submucosal fibroids.  PROCEDURE:  Diagnostic hysteroscopy, hysteroscopic resection of endometrial polyp and submucosal/intracavitary fibroid using MyoSure, dilation and curettage.  POSTOPERATIVE DIAGNOSIS:  Menorrhagia, endometrial polyps, intracavitary submucosal fibroid.  ANESTHESIA:  General.  SURGEON:  Ivery Marking, MD.  ASSISTANT:  None.  DESCRIPTION OF PROCEDURE:  Under adequate general anesthesia, the patient was placed in the dorsal lithotomy position.  She was sterilely prepped and draped in a usual fashion.  Examination under anesthesia revealed an anteverted uterus.  No adnexal  masses could be appreciated.  A bivalve speculum was placed in the vagina.  A single-tooth tenaculum was placed on the anterior lip of the cervix.  The cervix easily accepted a #19 Pratt dilator.  Using the diagnostic hysteroscope, the hysteroscope was  inserted into the uterine cavity.  An endometrial polyp was noted in the left tubal ostia as well as posterior.  On the right anterolateral wall, there was about a 2 cm pedunculated mass ultimately felt it was consistent with a pedunculated submucosal  fibroid/intracavitary fibroid.  Using the REACH resectoscope, the submucosal/intracavitary mass was resected entirely.  The endometrial polyp was resected.  The endometrium was resected.  When the entire cavity was resected and no lesions were present  and the endocervical canal was inspected, no lesions present all instruments were then removed from the vagina.  SPECIMEN LABELED:  Endometrial curetting with fibroid resection and endometrial polyp sent to pathology.  FLUID DEFICIT:  745 mL.  INTRAOPERATIVE FLUIDS:  600  mL.  ESTIMATED BLOOD LOSS:  5 mL.  COUNTS: Sponge and instrument counts x2 were correct.  COMPLICATIONS:  None.  DISPOSITION:  The patient tolerated the procedure well and was transferred to recovery in stable condition.   PUS D: 04/12/2024 12:00:19 pm T: 04/12/2024 4:24:00 pm  JOB: [id number]/ [id number]

## 2024-04-13 ENCOUNTER — Encounter (HOSPITAL_COMMUNITY): Payer: Self-pay | Admitting: Obstetrics and Gynecology

## 2024-04-13 LAB — SURGICAL PATHOLOGY

## 2024-08-05 ENCOUNTER — Inpatient Hospital Stay

## 2024-08-05 ENCOUNTER — Inpatient Hospital Stay: Admitting: Hematology and Oncology

## 2024-08-05 ENCOUNTER — Telehealth: Payer: Self-pay | Admitting: Hematology and Oncology

## 2024-08-05 NOTE — Telephone Encounter (Signed)
 Meredith Yoder re-scheduled her appointment because she is not feeling well.

## 2024-08-18 ENCOUNTER — Other Ambulatory Visit: Payer: Self-pay | Admitting: Oncology

## 2024-08-18 ENCOUNTER — Inpatient Hospital Stay

## 2024-08-18 ENCOUNTER — Telehealth: Payer: Self-pay

## 2024-08-18 ENCOUNTER — Encounter: Payer: Self-pay | Admitting: Oncology

## 2024-08-18 ENCOUNTER — Inpatient Hospital Stay: Attending: Oncology | Admitting: Oncology

## 2024-08-18 VITALS — BP 150/100 | HR 101 | Temp 97.9°F | Resp 17 | Wt 263.6 lb

## 2024-08-18 DIAGNOSIS — D500 Iron deficiency anemia secondary to blood loss (chronic): Secondary | ICD-10-CM | POA: Insufficient documentation

## 2024-08-18 LAB — CBC WITH DIFFERENTIAL (CANCER CENTER ONLY)
Abs Immature Granulocytes: 0.01 K/uL (ref 0.00–0.07)
Basophils Absolute: 0.1 K/uL (ref 0.0–0.1)
Basophils Relative: 1 %
Eosinophils Absolute: 0.1 K/uL (ref 0.0–0.5)
Eosinophils Relative: 1 %
HCT: 32.6 % — ABNORMAL LOW (ref 36.0–46.0)
Hemoglobin: 10.2 g/dL — ABNORMAL LOW (ref 12.0–15.0)
Immature Granulocytes: 0 %
Lymphocytes Relative: 24 %
Lymphs Abs: 1.4 K/uL (ref 0.7–4.0)
MCH: 22.2 pg — ABNORMAL LOW (ref 26.0–34.0)
MCHC: 31.3 g/dL (ref 30.0–36.0)
MCV: 71 fL — ABNORMAL LOW (ref 80.0–100.0)
Monocytes Absolute: 0.6 K/uL (ref 0.1–1.0)
Monocytes Relative: 10 %
Neutro Abs: 3.7 K/uL (ref 1.7–7.7)
Neutrophils Relative %: 64 %
Platelet Count: 316 K/uL (ref 150–400)
RBC: 4.59 MIL/uL (ref 3.87–5.11)
RDW: 18.5 % — ABNORMAL HIGH (ref 11.5–15.5)
WBC Count: 5.7 K/uL (ref 4.0–10.5)
nRBC: 0 % (ref 0.0–0.2)

## 2024-08-18 LAB — FERRITIN: Ferritin: 7 ng/mL — ABNORMAL LOW (ref 11–307)

## 2024-08-18 LAB — IRON AND IRON BINDING CAPACITY (CC-WL,HP ONLY)
Iron: 31 ug/dL (ref 28–170)
Saturation Ratios: 7 % — ABNORMAL LOW (ref 10.4–31.8)
TIBC: 459 ug/dL — ABNORMAL HIGH (ref 250–450)
UIBC: 428 ug/dL (ref 148–442)

## 2024-08-18 LAB — FOLATE: Folate: >20 ng/mL (ref >5.9)

## 2024-08-18 LAB — VITAMIN B12: Vitamin B-12: 607 pg/mL (ref 180–914)

## 2024-08-18 NOTE — Telephone Encounter (Signed)
 Dr. Pasam, patient will be scheduled as soon as possible.  Auth Submission: NO AUTH NEEDED Site of care: Site of care: [facility] Payer: [employer] state health Medication & CPT/J Code(s) submitted: Venofer (Iron Sucrose) J1756 Diagnosis Code:  Route of submission (phone, fax, portal):  Phone # Fax # Auth type: Buy/Bill PB Units/visits requested: 200mg  x 5 doses Reference number:  Approval from: 08/18/24 to 11/17/24

## 2024-08-18 NOTE — Assessment & Plan Note (Addendum)
 Chronic iron deficiency anemia with hemoglobin at 10.3 g/dL and ferritin at 4 ng/mL.   Recent uterine polyp removal in May 2025 was non-cancerous.   No other significant bleeding sources identified. Symptoms include pica (craving for ice) without dizziness, lightheadedness, or palpitations. Previous oral iron supplements caused constipation. Presentation is consistent with iron deficiency anemia.  Labs today show persistent anemia with hemoglobin of 10.2, MCV 71.  White count and platelet count remain normal.  Iron studies pending.  - Since labs indicate persistent iron deficiency anemia, we will proceed with IV iron treatments.  Will get approval for Venofer.  She will receive 5 doses weekly at our infusion center on [address].  - Schedule follow-up appointment in four months to reassess iron levels and treatment efficacy.

## 2024-08-18 NOTE — Progress Notes (Signed)
 [facility]  HEMATOLOGY CLINIC CONSULTATION NOTE   PATIENT NAME: Meredith Yoder   MR#: [id number] DOB: 1986-01-05  DATE OF SERVICE: 08/18/2024  Patient Care Team: Claudene Pellet, MD as PCP - General (Family Medicine)  REASON FOR CONSULTATION/ CHIEF COMPLAINT:  Evaluation of anemia.  ASSESSMENT & PLAN:   Meredith Yoder is a 38 y.o. lady with a past medical history of hypertension, GERD, anxiety, was referred to our service for evaluation of iron deficiency anemia.    Iron deficiency anemia due to chronic blood loss Chronic iron deficiency anemia with hemoglobin at 10.3 g/dL and ferritin at 4 ng/mL.   Recent uterine polyp removal in May 2025 was non-cancerous.   No other significant bleeding sources identified. Symptoms include pica (craving for ice) without dizziness, lightheadedness, or palpitations. Previous oral iron supplements caused constipation. Presentation is consistent with iron deficiency anemia.  Labs today show persistent anemia with hemoglobin of 10.2, MCV 71.  White count and platelet count remain normal.  Iron studies pending.  - Since labs indicate persistent iron deficiency anemia, we will proceed with IV iron treatments.  Will get approval for Venofer.  She will receive 5 doses weekly at our infusion center on [address].  - Schedule follow-up appointment in four months to reassess iron levels and treatment efficacy.   Since the cause of anemia seems to be obvious from iron deficiency, I am not pursuing extensive workup at this time.  If inadequate response to iron replacement is noted, we will pursue workup to rule out other etiologies.  I reviewed lab results and outside records for this visit and discussed relevant results with the patient. Diagnosis, plan of care and treatment options were also discussed in detail with the patient. Opportunity provided to ask questions and answers provided to her apparent satisfaction. Provided  instructions to call our clinic with any problems, questions or concerns prior to return visit. I recommended to continue follow-up with PCP and sub-specialists. She verbalized understanding and agreed with the plan. No barriers to learning was detected.  Chandler Swiderski, MD [facility] - A DEPT OF [facility] [address] [place] [facility] [place] [address] Dept: [phone] Dept Fax: [phone]  08/18/2024 12:09 PM  HISTORY OF PRESENT ILLNESS:  Discussed the use of AI scribe software for clinical note transcription with the patient, who gave verbal consent to proceed.  History of Present Illness Meredith Yoder is a 38 year old female who presents with anemia. She was referred by Dr. Claudene for evaluation of anemia.  On 07/04/2024, labs at her PCPs office showed hemoglobin of 10.3, hematocrit 31.6, MCV 68.5.  White count and platelet count were within normal limits.  Ferritin decreased at 4.2.  She was referred to us  for further evaluation and management of iron deficiency anemia.  She has a history of anemia and has been experiencing symptoms for some time. She previously tried an iron supplement, which caused constipation, and she took a small dosage once a day. She has not tried other forms of iron supplementation.  She underwent fibroid removal in May 2025. The fibroid was non-cancerous.  No other bleeding issues such as epistaxis or blood in stools. She experiences some gum bleeding but no significant bleeding overall. No chest pain, dyspnea, dizziness, lightheadedness, or palpitations.  She reports a craving for ice but does not feel cold. Her hemoglobin levels have been consistently low, with a level  of 11 g/dL in May and 89.4 g/dL in April of the previous year. Her last normal hemoglobin count was in 2018.   MEDICAL HISTORY:  Past Medical History:  Diagnosis Date   Hypertension    Murmur     SURGICAL HISTORY: Past  Surgical History:  Procedure Laterality Date   DENTAL SURGERY     HYSTEROSCOPY WITH MYOMECTOMY N/A 04/12/2024   Procedure: HYSTEROSCOPY WITH MYOMECTOMY;  Surgeon: Rutherford Gain, MD;  Location: [facility] OR;  Service: Gynecology;  Laterality: N/A;   MYOSURE RESECTION N/A 04/12/2024   Procedure: MELINDA RESECTION;  Surgeon: Rutherford Gain, MD;  Location: [facility] OR;  Service: Gynecology;  Laterality: N/A;    SOCIAL HISTORY: She reports that she has never smoked. She has never used smokeless tobacco. She reports that she does not drink alcohol and does not use drugs. Social History   Socioeconomic History   Marital status: Single    Spouse name: Not on file   Number of children: Not on file   Years of education: Not on file   Highest education level: Not on file  Occupational History   Not on file  Tobacco Use   Smoking status: Never   Smokeless tobacco: Never  Vaping Use   Vaping status: Never Used  Substance and Sexual Activity   Alcohol use: No    Comment: occasional   Drug use: No   Sexual activity: Yes    Birth control/protection: None  Other Topics Concern   Not on file  Social History Narrative   Not on file   Social Drivers of Health   Financial Resource Strain: Not on file  Food Insecurity: No Food Insecurity (08/18/2024)   Hunger Vital Sign    Worried About Running Out of Food in the Last Year: Never true    Ran Out of Food in the Last Year: Never true  Transportation Needs: No Transportation Needs (08/18/2024)   PRAPARE - Administrator, Civil Service (Medical): No    Lack of Transportation (Non-Medical): No  Physical Activity: Not on file  Stress: Not on file  Social Connections: Not on file  Intimate Partner Violence: Not At Risk (08/18/2024)   Humiliation, Afraid, Rape, and Kick questionnaire    Fear of Current or Ex-Partner: No    Emotionally Abused: No    Physically Abused: No    Sexually Abused: No    FAMILY HISTORY: Family History   Problem Relation Age of Onset   Healthy Mother    Hypertension Father    Allergic rhinitis Maternal Grandmother    Asthma Neg Hx    Eczema Neg Hx    Urticaria Neg Hx     ALLERGIES:  She is allergic to escitalopram oxalate, hydrochlorothiazide, and sertraline hcl.  MEDICATIONS:  Current Outpatient Medications  Medication Sig Dispense Refill   amLODipine (NORVASC) 5 MG tablet Take 10 mg by mouth daily.     ELDERBERRY PO Take by mouth.     fluticasone  (FLONASE ) 50 MCG/ACT nasal spray Place 1 spray into both nostrils 2 (two) times daily as needed (nasal congestion). 16 g 3   ibuprofen  (ADVIL ) 800 MG tablet Take 1 tablet (800 mg total) by mouth every 8 (eight) hours as needed. 30 tablet 1   Multiple Vitamin (MULTIVITAMIN ADULT PO) Take by mouth.     No current facility-administered medications for this visit.    REVIEW OF SYSTEMS:    Review of Systems - Oncology  All other pertinent systems were reviewed and  were negative except as mentioned above.  PHYSICAL EXAMINATION:   Onc Performance Status - 08/18/24 1123       ECOG Perf Status   ECOG Perf Status Restricted in physically strenuous activity but ambulatory and able to carry out work of a light or sedentary nature, e.g., light house work, office work      KPS SCALE   KPS % SCORE Able to carry on normal activity, minor s/s of disease          Vitals:   08/18/24 1105 08/18/24 1108  BP: (!) 180/120 (!) 150/100  Pulse:  (!) 101  Resp:  17  Temp:  97.9 F (36.6 C)  SpO2:  99%   Filed Weights   08/18/24 1108  Weight: 263 lb 9.6 oz (119.6 kg)    Physical Exam Constitutional:      General: She is not in acute distress.    Appearance: Normal appearance.  HENT:     Head: Normocephalic and atraumatic.  Cardiovascular:     Rate and Rhythm: Normal rate.  Pulmonary:     Effort: Pulmonary effort is normal. No respiratory distress.  Abdominal:     General: There is no distension.  Neurological:     General: No  focal deficit present.     Mental Status: She is alert and oriented to person, place, and time.  Psychiatric:        Mood and Affect: Mood normal.        Behavior: Behavior normal.      LABORATORY DATA:   I have reviewed the data as listed.  Results for orders placed or performed in visit on 08/18/24  CBC with Differential (Cancer Center Only)  Result Value Ref Range   WBC Count 5.7 4.0 - 10.5 K/uL   RBC 4.59 3.87 - 5.11 MIL/uL   Hemoglobin 10.2 (L) 12.0 - 15.0 g/dL   HCT 67.3 (L) 63.9 - 53.9 %   MCV 71.0 (L) 80.0 - 100.0 fL   MCH 22.2 (L) 26.0 - 34.0 pg   MCHC 31.3 30.0 - 36.0 g/dL   RDW 81.4 (H) 88.4 - 84.4 %   Platelet Count 316 150 - 400 K/uL   nRBC 0.0 0.0 - 0.2 %   Neutrophils Relative % 64 %   Neutro Abs 3.7 1.7 - 7.7 K/uL   Lymphocytes Relative 24 %   Lymphs Abs 1.4 0.7 - 4.0 K/uL   Monocytes Relative 10 %   Monocytes Absolute 0.6 0.1 - 1.0 K/uL   Eosinophils Relative 1 %   Eosinophils Absolute 0.1 0.0 - 0.5 K/uL   Basophils Relative 1 %   Basophils Absolute 0.1 0.0 - 0.1 K/uL   Immature Granulocytes 0 %   Abs Immature Granulocytes 0.01 0.00 - 0.07 K/uL     RADIOGRAPHIC STUDIES:  No recent pertinent imaging studies available to review.  Orders Placed This Encounter  Procedures   CBC with Differential (Cancer Center Only)    Standing Status:   Future    Number of Occurrences:   1    Expiration Date:   08/18/2025   Iron and Iron Binding Capacity ([facility] only)    Standing Status:   Future    Number of Occurrences:   1    Expiration Date:   08/18/2025   Ferritin    Standing Status:   Future    Number of Occurrences:   1    Expiration Date:   08/18/2025   Vitamin B12  Standing Status:   Future    Number of Occurrences:   1    Expiration Date:   08/18/2025   Folate    Standing Status:   Future    Number of Occurrences:   1    Expiration Date:   08/18/2025    Future Appointments  Date Time Provider Department Center  12/14/2024  8:00 AM  [facility] None  12/14/2024  8:30 AM Eulene Pekar, MD [facility] None     I spent a total of 40 minutes during this encounter with the patient including review of chart and various tests results, discussions about plan of care and coordination of care plan.  This document was completed utilizing speech recognition software. Grammatical errors, random word insertions, pronoun errors, and incomplete sentences are an occasional consequence of this system due to software limitations, ambient noise, and hardware issues. Any formal questions or concerns about the content, text or information contained within the body of this dictation should be directly addressed to the provider for clarification.

## 2024-08-30 ENCOUNTER — Ambulatory Visit (INDEPENDENT_AMBULATORY_CARE_PROVIDER_SITE_OTHER)

## 2024-08-30 VITALS — BP 124/77 | HR 86 | Temp 99.0°F | Resp 18 | Ht 65.0 in | Wt 264.2 lb

## 2024-08-30 DIAGNOSIS — D509 Iron deficiency anemia, unspecified: Secondary | ICD-10-CM | POA: Diagnosis not present

## 2024-08-30 DIAGNOSIS — D500 Iron deficiency anemia secondary to blood loss (chronic): Secondary | ICD-10-CM

## 2024-08-30 MED ORDER — IRON SUCROSE 20 MG/ML IV SOLN
200.0000 mg | Freq: Once | INTRAVENOUS | Status: AC
  Administered 2024-08-30: 200 mg via INTRAVENOUS
  Filled 2024-08-30: qty 10

## 2024-08-30 MED ORDER — SODIUM CHLORIDE 0.9 % IV BOLUS (SEPSIS)
250.0000 mL | Freq: Once | INTRAVENOUS | Status: DC
  Filled 2024-08-30: qty 250

## 2024-08-30 MED ORDER — ACETAMINOPHEN 325 MG PO TABS
650.0000 mg | ORAL_TABLET | Freq: Once | ORAL | Status: AC
  Administered 2024-08-30: 650 mg via ORAL
  Filled 2024-08-30: qty 2

## 2024-08-30 MED ORDER — DIPHENHYDRAMINE HCL 25 MG PO CAPS
25.0000 mg | ORAL_CAPSULE | Freq: Once | ORAL | Status: AC
  Administered 2024-08-30: 25 mg via ORAL
  Filled 2024-08-30: qty 1

## 2024-08-30 NOTE — Progress Notes (Signed)
 Diagnosis: Iron  Deficiency Anemia  Provider:  Praveen Mannam MD  Procedure: IV Push  IV Type: Peripheral, IV Location: L Antecubital  Venofer  (Iron  Sucrose), Dose: 200 mg  Post Infusion IV Care: Observation period completed and Peripheral IV Discontinued  Discharge: Condition: Good, Destination: Home . AVS Declined  Performed by:  Leita FORBES Miles, LPN

## 2024-09-01 ENCOUNTER — Ambulatory Visit

## 2024-09-01 MED ORDER — ACETAMINOPHEN 325 MG PO TABS: 650.0000 mg | ORAL_TABLET | Freq: Once | ORAL | Status: DC

## 2024-09-01 MED ORDER — DIPHENHYDRAMINE HCL 25 MG PO CAPS: 25.0000 mg | ORAL_CAPSULE | Freq: Once | ORAL | Status: DC

## 2024-09-01 MED ORDER — IRON SUCROSE 20 MG/ML IV SOLN: 200.0000 mg | Freq: Once | INTRAVENOUS | Status: DC

## 2024-09-05 ENCOUNTER — Ambulatory Visit (INDEPENDENT_AMBULATORY_CARE_PROVIDER_SITE_OTHER)

## 2024-09-05 VITALS — BP 130/84 | HR 75 | Temp 98.9°F | Resp 16 | Ht 65.0 in | Wt 266.0 lb

## 2024-09-05 DIAGNOSIS — D509 Iron deficiency anemia, unspecified: Secondary | ICD-10-CM | POA: Diagnosis not present

## 2024-09-05 DIAGNOSIS — D500 Iron deficiency anemia secondary to blood loss (chronic): Secondary | ICD-10-CM

## 2024-09-05 MED ORDER — ACETAMINOPHEN 325 MG PO TABS: 650.0000 mg | ORAL_TABLET | Freq: Once | ORAL | Status: DC

## 2024-09-05 MED ORDER — DIPHENHYDRAMINE HCL 25 MG PO CAPS: 25.0000 mg | ORAL_CAPSULE | Freq: Once | ORAL | Status: DC

## 2024-09-05 MED ORDER — IRON SUCROSE 20 MG/ML IV SOLN
200.0000 mg | Freq: Once | INTRAVENOUS | Status: AC
  Administered 2024-09-05: 200 mg via INTRAVENOUS
  Filled 2024-09-05: qty 10

## 2024-09-05 NOTE — Progress Notes (Signed)
 Diagnosis: Iron Deficiency Anemia  Provider:  Praveen Mannam MD  Procedure: IV Push  IV Type: Peripheral, IV Location: L Antecubital  Venofer (Iron Sucrose), Dose: 200 mg  Post Infusion IV Care: Observation period completed and Peripheral IV Discontinued  Discharge: Condition: Good, Destination: Home . AVS Declined  Performed by:  Surya Schroeter, RN

## 2024-09-09 ENCOUNTER — Ambulatory Visit

## 2024-09-09 MED ORDER — DIPHENHYDRAMINE HCL 25 MG PO CAPS: 25.0000 mg | ORAL_CAPSULE | Freq: Once | ORAL | Status: DC

## 2024-09-09 MED ORDER — IRON SUCROSE 20 MG/ML IV SOLN: 200.0000 mg | Freq: Once | INTRAVENOUS | Status: DC

## 2024-09-09 MED ORDER — ACETAMINOPHEN 325 MG PO TABS: 650.0000 mg | ORAL_TABLET | Freq: Once | ORAL | Status: DC

## 2024-09-12 ENCOUNTER — Ambulatory Visit

## 2024-09-12 VITALS — BP 128/85 | HR 72 | Temp 98.6°F | Resp 18 | Ht 65.0 in | Wt 266.8 lb

## 2024-09-12 DIAGNOSIS — D509 Iron deficiency anemia, unspecified: Secondary | ICD-10-CM | POA: Diagnosis not present

## 2024-09-12 DIAGNOSIS — D500 Iron deficiency anemia secondary to blood loss (chronic): Secondary | ICD-10-CM

## 2024-09-12 MED ORDER — ACETAMINOPHEN 325 MG PO TABS: 650.0000 mg | ORAL_TABLET | Freq: Once | ORAL | Status: DC

## 2024-09-12 MED ORDER — IRON SUCROSE 20 MG/ML IV SOLN
200.0000 mg | Freq: Once | INTRAVENOUS | Status: AC
  Administered 2024-09-12: 200 mg via INTRAVENOUS
  Filled 2024-09-12: qty 10

## 2024-09-12 MED ORDER — SODIUM CHLORIDE 0.9 % IV BOLUS (SEPSIS)
250.0000 mL | Freq: Once | INTRAVENOUS | Status: DC
  Filled 2024-09-12: qty 250

## 2024-09-12 MED ORDER — DIPHENHYDRAMINE HCL 25 MG PO CAPS: 25.0000 mg | ORAL_CAPSULE | Freq: Once | ORAL | Status: DC

## 2024-09-12 NOTE — Progress Notes (Signed)
 Diagnosis: Iron Deficiency Anemia  Provider:  Chilton Greathouse MD  Procedure: IV Push  IV Type: Peripheral, IV Location: L Antecubital  Venofer (Iron Sucrose), Dose: 200 mg  Post Infusion IV Care: Patient declined observation and Peripheral IV Discontinued  Discharge: Condition: Good, Destination: Home . AVS Declined  Performed by:  Loney Hering, LPN

## 2024-09-15 ENCOUNTER — Ambulatory Visit (INDEPENDENT_AMBULATORY_CARE_PROVIDER_SITE_OTHER)

## 2024-09-15 VITALS — BP 144/84 | HR 78 | Temp 98.1°F | Resp 16 | Ht 65.0 in | Wt 266.2 lb

## 2024-09-15 DIAGNOSIS — D509 Iron deficiency anemia, unspecified: Secondary | ICD-10-CM | POA: Diagnosis not present

## 2024-09-15 DIAGNOSIS — D500 Iron deficiency anemia secondary to blood loss (chronic): Secondary | ICD-10-CM

## 2024-09-15 MED ORDER — ACETAMINOPHEN 325 MG PO TABS: 650.0000 mg | ORAL_TABLET | Freq: Once | ORAL | Status: DC

## 2024-09-15 MED ORDER — IRON SUCROSE 20 MG/ML IV SOLN
200.0000 mg | Freq: Once | INTRAVENOUS | Status: AC
  Administered 2024-09-15: 200 mg via INTRAVENOUS
  Filled 2024-09-15: qty 10

## 2024-09-15 MED ORDER — SODIUM CHLORIDE 0.9 % IV BOLUS (SEPSIS)
250.0000 mL | Freq: Once | INTRAVENOUS | Status: DC
  Filled 2024-09-15: qty 250

## 2024-09-15 MED ORDER — DIPHENHYDRAMINE HCL 25 MG PO CAPS: 25.0000 mg | ORAL_CAPSULE | Freq: Once | ORAL | Status: DC

## 2024-09-15 NOTE — Progress Notes (Signed)
 Diagnosis: Iron  Deficiency Anemia  Provider:  Praveen Mannam MD  Procedure: IV Push  IV Type: Peripheral, IV Location: L Antecubital  Venofer  (Iron  Sucrose), Dose: 200 mg  Post Infusion IV Care: Patient declined observation and Peripheral IV Discontinued  Discharge: Condition: Good, Destination: Home . AVS Declined  Performed by:  Waddell LOISE Freshwater, RN

## 2024-09-20 ENCOUNTER — Ambulatory Visit

## 2024-09-20 VITALS — BP 125/80 | HR 84 | Temp 98.0°F | Resp 20 | Ht 65.0 in | Wt 266.4 lb

## 2024-09-20 DIAGNOSIS — D500 Iron deficiency anemia secondary to blood loss (chronic): Secondary | ICD-10-CM

## 2024-09-20 MED ORDER — ACETAMINOPHEN 325 MG PO TABS: 650.0000 mg | ORAL_TABLET | Freq: Once | ORAL | Status: DC

## 2024-09-20 MED ORDER — DIPHENHYDRAMINE HCL 25 MG PO CAPS: 25.0000 mg | ORAL_CAPSULE | Freq: Once | ORAL | Status: DC

## 2024-09-20 MED ORDER — IRON SUCROSE 20 MG/ML IV SOLN
200.0000 mg | Freq: Once | INTRAVENOUS | Status: AC
  Administered 2024-09-20: 200 mg via INTRAVENOUS
  Filled 2024-09-20: qty 10

## 2024-09-20 NOTE — Progress Notes (Signed)
 Diagnosis: Iron Deficiency Anemia  Provider:  Chilton Greathouse MD  Procedure: IV Push  IV Type: Peripheral, IV Location: L Antecubital  Venofer (Iron Sucrose), Dose: 200 mg  Post Infusion IV Care: Observation period completed and Peripheral IV Discontinued  Discharge: Condition: Good, Destination: Home . AVS Declined  Performed by:  Marlow Baars [place], RN

## 2024-12-13 ENCOUNTER — Other Ambulatory Visit: Payer: Self-pay | Admitting: Oncology

## 2024-12-13 DIAGNOSIS — D500 Iron deficiency anemia secondary to blood loss (chronic): Secondary | ICD-10-CM

## 2024-12-14 ENCOUNTER — Inpatient Hospital Stay: Attending: Oncology

## 2024-12-14 ENCOUNTER — Inpatient Hospital Stay (HOSPITAL_BASED_OUTPATIENT_CLINIC_OR_DEPARTMENT_OTHER): Admitting: Oncology

## 2024-12-14 ENCOUNTER — Encounter: Payer: Self-pay | Admitting: Oncology

## 2024-12-14 VITALS — BP 130/68 | HR 94 | Temp 97.7°F | Resp 16 | Wt 267.3 lb

## 2024-12-14 DIAGNOSIS — K5900 Constipation, unspecified: Secondary | ICD-10-CM | POA: Diagnosis not present

## 2024-12-14 DIAGNOSIS — K219 Gastro-esophageal reflux disease without esophagitis: Secondary | ICD-10-CM | POA: Insufficient documentation

## 2024-12-14 DIAGNOSIS — D509 Iron deficiency anemia, unspecified: Secondary | ICD-10-CM | POA: Diagnosis present

## 2024-12-14 DIAGNOSIS — I10 Essential (primary) hypertension: Secondary | ICD-10-CM | POA: Diagnosis not present

## 2024-12-14 DIAGNOSIS — Z79899 Other long term (current) drug therapy: Secondary | ICD-10-CM | POA: Diagnosis not present

## 2024-12-14 DIAGNOSIS — Z86018 Personal history of other benign neoplasm: Secondary | ICD-10-CM | POA: Diagnosis not present

## 2024-12-14 DIAGNOSIS — D500 Iron deficiency anemia secondary to blood loss (chronic): Secondary | ICD-10-CM

## 2024-12-14 LAB — CBC WITH DIFFERENTIAL (CANCER CENTER ONLY)
Abs Immature Granulocytes: 0.01 K/uL (ref 0.00–0.07)
Basophils Absolute: 0.1 K/uL (ref 0.0–0.1)
Basophils Relative: 1 %
Eosinophils Absolute: 0.1 K/uL (ref 0.0–0.5)
Eosinophils Relative: 1 %
HCT: 40 % (ref 36.0–46.0)
Hemoglobin: 13.1 g/dL (ref 12.0–15.0)
Immature Granulocytes: 0 %
Lymphocytes Relative: 23 %
Lymphs Abs: 1.4 K/uL (ref 0.7–4.0)
MCH: 27 pg (ref 26.0–34.0)
MCHC: 32.8 g/dL (ref 30.0–36.0)
MCV: 82.3 fL (ref 80.0–100.0)
Monocytes Absolute: 0.4 K/uL (ref 0.1–1.0)
Monocytes Relative: 6 %
Neutro Abs: 4.2 K/uL (ref 1.7–7.7)
Neutrophils Relative %: 69 %
Platelet Count: 213 K/uL (ref 150–400)
RBC: 4.86 MIL/uL (ref 3.87–5.11)
RDW: 14.9 % (ref 11.5–15.5)
WBC Count: 6.1 K/uL (ref 4.0–10.5)
nRBC: 0 % (ref 0.0–0.2)

## 2024-12-14 LAB — IRON AND IRON BINDING CAPACITY (CC-WL,HP ONLY)
Iron: 76 ug/dL (ref 28–170)
Saturation Ratios: 22 % (ref 10.4–31.8)
TIBC: 343 ug/dL (ref 250–450)
UIBC: 267 ug/dL

## 2024-12-14 LAB — FERRITIN: Ferritin: 28 ng/mL (ref 11–307)

## 2024-12-14 NOTE — Progress Notes (Signed)
 "  [facility]  HEMATOLOGY [facility]INIC PROGRESS NOTE  PATIENT NAME: Meredith [facility]   MR#: [id number] DOB: 09/06/1986  Patient Care Team: Meredith Pellet, MD as PCP - General (Family Medicine)  Date of visit: 12/14/2024   ASSESSMENT & PLAN:   Meredith [facility] is a 39 y.o. lady with a past medical history of hypertension, GERD, anxiety, was referred to our service in September 2025 for evaluation of iron  deficiency anemia. She was treated with IV iron  using Venofer  x 5 doses.  Iron  deficiency anemia due to chronic blood loss Chronic iron  deficiency anemia with hemoglobin at 10.3 g/dL and ferritin at 4 ng/mL.   Recent uterine polyp removal in May 2025 was non-cancerous.   No other significant bleeding sources identified. Symptoms include pica (craving for ice) without dizziness, lightheadedness, or palpitations. Previous oral iron  supplements caused constipation. Presentation is consistent with iron  deficiency anemia.  On her consultation with us  on 08/18/2024, labs showed persistent anemia with hemoglobin of 10.2, MCV 71.  White count and platelet count were normal.  Iron  saturation decreased at 7%, ferritin decreased at 7.  We treated her with IV iron  using Venofer  x 5 doses.  Repeat labs on 12/14/2024 showed normal hemoglobin of 13.1, MCV normal at 82.3.  Iron  studies showed no evidence of iron  deficiency.  She was advised to continue oral iron  supplements.  No indication for IV iron  currently.  RTC in 6 months for follow-up with repeat labs.   I spent a total of 22 minutes during this encounter with the patient including review of chart and various tests results, discussions about plan of care and coordination of care plan.  I reviewed lab results and outside records for this visit and discussed relevant results with the patient. Diagnosis, plan of care and treatment options were also discussed in detail with the patient. Opportunity provided to ask questions and  answers provided to her apparent satisfaction. Provided instructions to call our clinic with any problems, questions or concerns prior to return visit. I recommended to continue follow-up with PCP and sub-specialists. She verbalized understanding and agreed with the plan. No barriers to learning was detected.  Meredith Patten, MD  12/14/2024 3:55 PM  [facility] - A DEPT OF [facility] [address] [place] [address] Dept: [phone] Dept Fax: [phone]   CHIEF COMPLAINT/ REASON FOR VISIT:  Follow-up for history of iron  deficiency anemia, treated with IV iron  using Venofer  in September to October 2025.  INTERVAL HISTORY:  Discussed the use of AI scribe software for clinical note transcription with the patient, who gave verbal consent to proceed.  History of Present Illness The patient, with iron  deficiency anemia secondary to chronic blood loss, presents for hematology follow-up to assess response to IV iron  therapy and ongoing management.  She completed five weekly intravenous iron  infusions approximately three to four months ago. Since treatment, she reports significant improvement in overall well-being and resolution of prior symptoms.  Her most recent laboratory studies demonstrate normalization of hemoglobin (13.1 g/dL) and iron  indices. Previous vitamin B12 and folic acid  levels were within normal limits.  Menstrual cycles have improved following uterine fibroid removal in May of the previous year, with cycles now less symptomatic. She denies chest pain, dyspnea, fatigue, or any new symptoms suggestive of ongoing anemia. She has not experienced any issues with hemoglobin since her last visit.  She is not currently taking oral iron  supplementation  but has purchased iron  gummies and was awaiting further guidance before initiation. She denies gastrointestinal side effects such as constipation or abdominal  discomfort.   SUMMARY OF HEMATOLOGIC HISTORY:  She was referred by Dr. Claudene for evaluation of anemia.   On 07/04/2024, labs at her PCPs office showed hemoglobin of 10.3, hematocrit 31.6, MCV 68.5.  White count and platelet count were within normal limits.  Ferritin decreased at 4.2.  She was referred to us  for further evaluation and management of iron  deficiency anemia.   She has a history of anemia and has been experiencing symptoms for some time. She previously tried an iron  supplement, which caused constipation, and she took a small dosage once a day. She has not tried other forms of iron  supplementation.   She underwent fibroid removal in May 2025. The fibroid was non-cancerous.   No other bleeding issues such as epistaxis or blood in stools. She experiences some gum bleeding but no significant bleeding overall. No chest pain, dyspnea, dizziness, lightheadedness, or palpitations.   She reported a craving for ice but does not feel cold. Her hemoglobin levels have been consistently low, with a level of 11 g/dL in May and 89.4 g/dL in April of the previous year. Her last normal hemoglobin count was in 2018.  On her consultation with us  on 08/18/2024, labs showed persistent anemia with hemoglobin of 10.2, MCV 71.  White count and platelet count were normal.  Iron  saturation decreased at 7%, ferritin decreased at 7.  We treated her with IV iron  using Venofer  x 5 doses.  Repeat labs on 12/14/2024 showed normal hemoglobin of 13.1, MCV normal at 82.3.  Iron  studies showed no evidence of iron  deficiency.  I have reviewed the past medical history, past surgical history, social history and family history with the patient and they are unchanged from previous note.  ALLERGIES: She is allergic to escitalopram oxalate, hydrochlorothiazide, and sertraline hcl.  MEDICATIONS:  Current Outpatient Medications  Medication Sig Dispense Refill   amLODipine (NORVASC) 10 MG tablet Take 10 mg by mouth daily.      ELDERBERRY PO Take by mouth.     Multiple Vitamins-Minerals (MULTIVITAMIN ADULTS) TABS Take 1 tablet by mouth daily.     No current facility-administered medications for this visit.     REVIEW OF SYSTEMS:    Review of Systems - Oncology  All other pertinent systems were reviewed with the patient and are negative.  PHYSICAL EXAMINATION:   Onc Performance Status - 12/14/24 0822       KPS SCALE   KPS % SCORE Normal, no compliants, no evidence of disease          Vitals:   12/14/24 0808  BP: 130/68  Pulse: 94  Resp: 16  Temp: 97.7 F (36.5 C)  SpO2: 100%   Filed Weights   12/14/24 0808  Weight: 267 lb 4.8 oz (121.2 kg)    Physical Exam Constitutional:      General: She is not in acute distress.    Appearance: Normal appearance.  HENT:     Head: Normocephalic and atraumatic.  Cardiovascular:     Rate and Rhythm: Normal rate.  Pulmonary:     Effort: Pulmonary effort is normal. No respiratory distress.  Abdominal:     General: There is no distension.  Neurological:     General: No focal deficit present.     Mental Status: She is alert and oriented to person, place, and time.  Psychiatric:  Mood and Affect: Mood normal.        Behavior: Behavior normal.     LABORATORY DATA:   I have reviewed the data as listed.  Results for orders placed or performed in visit on 12/14/24  CBC with Differential ([facility])  Result Value Ref Range   WBC Count 6.1 4.0 - 10.5 K/uL   RBC 4.86 3.87 - 5.11 MIL/uL   Hemoglobin 13.1 12.0 - 15.0 g/dL   HCT 59.9 63.9 - 53.9 %   MCV 82.3 80.0 - 100.0 fL   MCH 27.0 26.0 - 34.0 pg   MCHC 32.8 30.0 - 36.0 g/dL   RDW 85.0 88.4 - 84.4 %   Platelet Count 213 150 - 400 K/uL   nRBC 0.0 0.0 - 0.2 %   Neutrophils Relative % 69 %   Neutro Abs 4.2 1.7 - 7.7 K/uL   Lymphocytes Relative 23 %   Lymphs Abs 1.4 0.7 - 4.0 K/uL   Monocytes Relative 6 %   Monocytes Absolute 0.4 0.1 - 1.0 K/uL   Eosinophils Relative 1 %    Eosinophils Absolute 0.1 0.0 - 0.5 K/uL   Basophils Relative 1 %   Basophils Absolute 0.1 0.0 - 0.1 K/uL   Immature Granulocytes 0 %   Abs Immature Granulocytes 0.01 0.00 - 0.07 K/uL  Iron  and Iron  Binding Capacity ([facility] only)  Result Value Ref Range   Iron  76 28 - 170 ug/dL   TIBC 656 749 - 549 ug/dL   Saturation Ratios 22 10.4 - 31.8 %   UIBC 267 ug/dL  Ferritin  Result Value Ref Range   Ferritin 28 11 - 307 ng/mL    RADIOGRAPHIC STUDIES:  No recent pertinent imaging studies available to review.  Orders Placed This Encounter  Procedures   CBC with Differential ([facility])    Standing Status:   Future    Expiration Date:   12/14/2025   Iron  and Iron  Binding Capacity ([facility] only)    Standing Status:   Future    Expiration Date:   12/14/2025   Ferritin    Standing Status:   Future    Expiration Date:   12/14/2025     Future Appointments  Date Time Provider Department [facility]  06/14/2025  8:15 AM [facility] None  06/14/2025  8:45 AM Shelise Maron, [place], MD [facility] None     This document was completed utilizing speech recognition software. Grammatical errors, random word insertions, pronoun errors, and incomplete sentences are an occasional consequence of this system due to software limitations, ambient noise, and hardware issues. Any formal questions or concerns about the content, text or information contained within the body of this dictation should be directly addressed to the provider for clarification.  "

## 2024-12-14 NOTE — Assessment & Plan Note (Addendum)
 Chronic iron  deficiency anemia with hemoglobin at 10.3 g/dL and ferritin at 4 ng/mL.   Recent uterine polyp removal in May 2025 was non-cancerous.   No other significant bleeding sources identified. Symptoms include pica (craving for ice) without dizziness, lightheadedness, or palpitations. Previous oral iron  supplements caused constipation. Presentation is consistent with iron  deficiency anemia.  On her consultation with us  on 08/18/2024, labs showed persistent anemia with hemoglobin of 10.2, MCV 71.  White count and platelet count were normal.  Iron  saturation decreased at 7%, ferritin decreased at 7.  We treated her with IV iron  using Venofer  x 5 doses.  Repeat labs on 12/14/2024 showed normal hemoglobin of 13.1, MCV normal at 82.3.  Iron  studies showed no evidence of iron  deficiency.  She was advised to continue oral iron  supplements.  No indication for IV iron  currently.  RTC in 6 months for follow-up with repeat labs.

## 2025-06-14 ENCOUNTER — Inpatient Hospital Stay

## 2025-06-14 ENCOUNTER — Inpatient Hospital Stay: Admitting: Oncology
